# Patient Record
Sex: Female | Born: 1985 | Race: White | Hispanic: No | Marital: Single | Smoking: Current some day smoker
Health system: Southern US, Community
[De-identification: ages and names within clinical notes are randomized; demographics above are authoritative.]

## PROBLEM LIST (undated history)

## (undated) DIAGNOSIS — F419 Anxiety disorder, unspecified: Secondary | ICD-10-CM

## (undated) DIAGNOSIS — Z87442 Personal history of urinary calculi: Secondary | ICD-10-CM

## (undated) DIAGNOSIS — R519 Headache, unspecified: Secondary | ICD-10-CM

## (undated) DIAGNOSIS — R51 Headache: Secondary | ICD-10-CM

## (undated) DIAGNOSIS — L309 Dermatitis, unspecified: Secondary | ICD-10-CM

## (undated) HISTORY — PX: WISDOM TOOTH EXTRACTION: SHX21

---

## 2013-01-18 ENCOUNTER — Ambulatory Visit: Payer: Self-pay | Admitting: Family Medicine

## 2013-03-28 ENCOUNTER — Ambulatory Visit: Payer: Self-pay

## 2013-03-28 LAB — URINALYSIS, COMPLETE
Glucose,UR: NEGATIVE mg/dL (ref 0–75)
Leukocyte Esterase: NEGATIVE
Nitrite: POSITIVE
Ph: 7.5 (ref 4.5–8.0)

## 2013-05-15 ENCOUNTER — Encounter (HOSPITAL_BASED_OUTPATIENT_CLINIC_OR_DEPARTMENT_OTHER): Payer: Self-pay | Admitting: *Deleted

## 2013-05-15 ENCOUNTER — Emergency Department (HOSPITAL_BASED_OUTPATIENT_CLINIC_OR_DEPARTMENT_OTHER)
Admission: EM | Admit: 2013-05-15 | Discharge: 2013-05-15 | Disposition: A | Discharge status: Home / Self Care | Payer: BC Managed Care – PPO | Attending: Emergency Medicine | Admitting: Emergency Medicine

## 2013-05-15 DIAGNOSIS — R55 Syncope and collapse: Secondary | ICD-10-CM | POA: Insufficient documentation

## 2013-05-15 DIAGNOSIS — Z79899 Other long term (current) drug therapy: Secondary | ICD-10-CM | POA: Insufficient documentation

## 2013-05-15 DIAGNOSIS — R42 Dizziness and giddiness: Secondary | ICD-10-CM | POA: Insufficient documentation

## 2013-05-15 DIAGNOSIS — Z3202 Encounter for pregnancy test, result negative: Secondary | ICD-10-CM | POA: Insufficient documentation

## 2013-05-15 DIAGNOSIS — Z872 Personal history of diseases of the skin and subcutaneous tissue: Secondary | ICD-10-CM | POA: Insufficient documentation

## 2013-05-15 DIAGNOSIS — F411 Generalized anxiety disorder: Secondary | ICD-10-CM | POA: Insufficient documentation

## 2013-05-15 DIAGNOSIS — F17200 Nicotine dependence, unspecified, uncomplicated: Secondary | ICD-10-CM | POA: Insufficient documentation

## 2013-05-15 HISTORY — DX: Anxiety disorder, unspecified: F41.9

## 2013-05-15 HISTORY — DX: Dermatitis, unspecified: L30.9

## 2013-05-15 LAB — BASIC METABOLIC PANEL
CO2: 23 mEq/L (ref 19–32)
Calcium: 9.7 mg/dL (ref 8.4–10.5)
Creatinine, Ser: 0.7 mg/dL (ref 0.50–1.10)
GFR calc Af Amer: >90 mL/min (ref >90)
GFR calc non Af Amer: >90 mL/min (ref >90)
Sodium: 138 mEq/L (ref 135–145)

## 2013-05-15 LAB — PREGNANCY, URINE: Preg Test, Ur: NEGATIVE

## 2013-05-15 LAB — CBC WITH DIFFERENTIAL/PLATELET
Basophils Absolute: 0 10*3/uL (ref 0.0–0.1)
Basophils Relative: 0 % (ref 0–1)
Eosinophils Absolute: 0.1 10*3/uL (ref 0.0–0.7)
Eosinophils Relative: 1 % (ref 0–5)
Lymphocytes Relative: 19 % (ref 12–46)
MCHC: 36.3 g/dL — ABNORMAL HIGH (ref 30.0–36.0)
MCV: 87.2 fL (ref 78.0–100.0)
Platelets: 193 10*3/uL (ref 150–400)
RDW: 12.1 % (ref 11.5–15.5)
WBC: 8.8 10*3/uL (ref 4.0–10.5)

## 2013-05-15 MED ORDER — SODIUM CHLORIDE 0.9 % IV BOLUS (SEPSIS)
1000.0000 mL | Freq: Once | INTRAVENOUS | Status: AC
  Administered 2013-05-15: 1000 mL via INTRAVENOUS

## 2013-05-15 NOTE — ED Notes (Signed)
Patient states she felt faint while in class and fainted, states she has been under a lot of stress lately and had not been eating for the past few days

## 2013-05-15 NOTE — ED Provider Notes (Signed)
History    CSN: [id number] Arrival date & time 05/15/13  1116  First MD Initiated Contact with Patient 05/15/13 1127     Chief Complaint  Patient presents with  . Loss of Consciousness   (Consider location/radiation/quality/duration/timing/severity/associated sxs/prior Treatment) HPI Comments: Patient presents after an apparent syncopal episode.  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  She then lost consciousness and fell to the floor.  She woke several minutes later.  No reported seizure-like activity, bowel or bladder incontinence.  No injury from the fall.  She still feels light-headed.  Patient is a 27 y.o. female presenting with syncope. The history is provided by the patient.  Loss of Consciousness Episode history:  Single Most recent episode:  Today Duration:  2 minutes Timing:  Constant Progression:  Resolved Chronicity:  New Witnessed: yes   Relieved by:  Nothing Worsened by:  Nothing tried Ineffective treatments:  None tried Associated symptoms: no anxiety, no confusion and no shortness of breath    Past Medical History  Diagnosis Date  . Anxiety   . Eczema    Past Surgical History  Procedure Laterality Date  . Wisdom tooth extraction     No family history on file. History  Substance Use Topics  . Smoking status: Current Every Day Smoker  . Smokeless tobacco: Not on file  . Alcohol Use: Yes   OB History   Grav Para Term Preterm Abortions TAB SAB Ect Mult Living                 Review of Systems  Respiratory: Negative for shortness of breath.   Cardiovascular: Positive for syncope.  Psychiatric/Behavioral: Negative for confusion.  All other systems reviewed and are negative.    Allergies  Review of patient's allergies indicates no known allergies.  Home Medications   Current Outpatient Rx  Name  Route  Sig  Dispense  Refill  . ALPRAZOLAM PO   Oral   Take  by mouth as needed.          BP 115/81  Pulse 119  Temp(Src) 98.1 F (36.7 C) (Oral)  Resp 20  SpO2 100% Physical Exam  Nursing note and vitals reviewed. Constitutional: She is oriented to person, place, and time. She appears well-developed and well-nourished. No distress.  HENT:  Head: Normocephalic and atraumatic.  Mouth/Throat: Oropharynx is clear and moist.  Eyes: EOM are normal. Pupils are equal, round, and reactive to light.  Neck: Normal range of motion. Neck supple.  Cardiovascular: Normal rate and regular rhythm.  Exam reveals no gallop and no friction rub.   No murmur heard. Pulmonary/Chest: Effort normal and breath sounds normal. No respiratory distress. She has no wheezes.  Abdominal: Soft. Bowel sounds are normal. She exhibits no distension. There is no tenderness.  Musculoskeletal: Normal range of motion.  Neurological: She is alert and oriented to person, place, and time. No cranial nerve deficit. She exhibits normal muscle tone. Coordination normal.  Skin: Skin is warm and dry. She is not diaphoretic.    ED Course  Procedures (including critical care time) Labs Reviewed  CBC WITH DIFFERENTIAL  BASIC METABOLIC PANEL  PREGNANCY, URINE   No results found. No diagnosis found.    Date: 05/15/2013  Rate: 83  Rhythm: normal sinus rhythm  QRS Axis: normal  Intervals: normal  ST/T Wave abnormalities: normal  Conduction Disutrbances:none  Narrative Interpretation:   Old  EKG Reviewed: none available    MDM  The patient presents here after an apparent syncopal episode.  She reports being under a lot of stress and has not eaten or slept well recently and I suspect this likely is the cause of the episode.  She is not pregnant, not anemic, and blood sugar and other labs are all unremarkable as is the ekg.  This does not sound like a seizure.  She will be discharged to home, to return if she worsens.    Geoffery Lyons, MD 05/15/13 [phone]

## 2013-05-23 ENCOUNTER — Inpatient Hospital Stay: Payer: Self-pay | Admitting: Obstetrics and Gynecology

## 2013-05-23 ENCOUNTER — Ambulatory Visit: Payer: Self-pay

## 2013-05-23 LAB — CBC WITH DIFFERENTIAL/PLATELET
Basophil %: 0.3 %
Eosinophil #: 0 10*3/uL (ref 0.0–0.7)
Lymphocyte #: 0.5 10*3/uL — ABNORMAL LOW (ref 1.0–3.6)
MCHC: 35.2 g/dL (ref 32.0–36.0)
Platelet: 160 10*3/uL (ref 150–440)
RBC: 4.66 10*6/uL (ref 3.80–5.20)
RDW: 12.4 % (ref 11.5–14.5)
WBC: 6.7 10*3/uL (ref 3.6–11.0)

## 2013-05-23 LAB — URINALYSIS, COMPLETE
Bacteria: NEGATIVE
Bilirubin,UR: NEGATIVE
Blood: NEGATIVE
Nitrite: NEGATIVE
Ph: 6 (ref 4.5–8.0)
Protein: 30
Specific Gravity: 1.015 (ref 1.003–1.030)

## 2013-05-23 LAB — COMPREHENSIVE METABOLIC PANEL
Alkaline Phosphatase: 88 U/L (ref 50–136)
BUN: 14 mg/dL (ref 7–18)
Co2: 26 mmol/L (ref 21–32)
EGFR (African American): >60
EGFR (Non-African Amer.): >60
Glucose: 91 mg/dL (ref 65–99)
SGOT(AST): 21 U/L (ref 15–37)
SGPT (ALT): 26 U/L (ref 12–78)

## 2013-05-23 LAB — PREGNANCY, URINE: Pregnancy Test, Urine: NEGATIVE m[IU]/mL

## 2013-05-24 LAB — WET PREP, GENITAL

## 2013-05-24 LAB — CBC WITH DIFFERENTIAL/PLATELET
Basophil #: 0 10*3/uL (ref 0.0–0.1)
Eosinophil %: 0.2 %
HGB: 13.9 g/dL (ref 12.0–16.0)
Lymphocyte %: 13.3 %
MCH: 31.1 pg (ref 26.0–34.0)
Monocyte %: 6 %
Neutrophil #: 4.9 10*3/uL (ref 1.4–6.5)
Platelet: 153 10*3/uL (ref 150–440)
RBC: 4.47 10*6/uL (ref 3.80–5.20)
WBC: 6.1 10*3/uL (ref 3.6–11.0)

## 2013-05-24 LAB — BASIC METABOLIC PANEL
Anion Gap: 7 (ref 7–16)
BUN: 15 mg/dL (ref 7–18)
Calcium, Total: 8.1 mg/dL — ABNORMAL LOW (ref 8.5–10.1)
Creatinine: 0.74 mg/dL (ref 0.60–1.30)
EGFR (Non-African Amer.): >60
Glucose: 86 mg/dL (ref 65–99)

## 2013-05-25 LAB — CBC WITH DIFFERENTIAL/PLATELET
Basophil %: 0.5 %
Eosinophil %: 3.2 %
HCT: 36.7 % (ref 35.0–47.0)
Lymphocyte #: 1.5 10*3/uL (ref 1.0–3.6)
Lymphocyte %: 38.6 %
MCH: 31.6 pg (ref 26.0–34.0)
MCHC: 35.7 g/dL (ref 32.0–36.0)
Monocyte #: 0.5 x10 3/mm (ref 0.2–0.9)
Monocyte %: 12.3 %
Platelet: 149 10*3/uL — ABNORMAL LOW (ref 150–440)
WBC: 3.8 10*3/uL (ref 3.6–11.0)

## 2013-05-25 LAB — BASIC METABOLIC PANEL
BUN: 10 mg/dL (ref 7–18)
Calcium, Total: 8.6 mg/dL (ref 8.5–10.1)
Chloride: 105 mmol/L (ref 98–107)
Co2: 32 mmol/L (ref 21–32)
Glucose: 98 mg/dL (ref 65–99)
Potassium: 3.7 mmol/L (ref 3.5–5.1)
Sodium: 143 mmol/L (ref 136–145)

## 2013-05-28 LAB — CULTURE, BLOOD (SINGLE)

## 2013-12-30 ENCOUNTER — Emergency Department: Payer: Self-pay | Admitting: Emergency Medicine

## 2013-12-30 LAB — CBC
HCT: 42.7 % (ref 35.0–47.0)
HGB: 14.7 g/dL (ref 12.0–16.0)
MCH: 31.1 pg (ref 26.0–34.0)
MCHC: 34.5 g/dL (ref 32.0–36.0)
MCV: 90 fL (ref 80–100)
Platelet: 204 10*3/uL (ref 150–440)
RBC: 4.73 10*6/uL (ref 3.80–5.20)
RDW: 12.4 % (ref 11.5–14.5)
WBC: 7.5 10*3/uL (ref 3.6–11.0)

## 2013-12-30 LAB — URINALYSIS, COMPLETE
BACTERIA: NONE SEEN
BLOOD: NEGATIVE
Bilirubin,UR: NEGATIVE
GLUCOSE, UR: NEGATIVE mg/dL (ref 0–75)
KETONE: NEGATIVE
Leukocyte Esterase: NEGATIVE
NITRITE: NEGATIVE
PH: 7 (ref 4.5–8.0)
PROTEIN: NEGATIVE
RBC,UR: 3 /HPF (ref 0–5)
Specific Gravity: 1.012 (ref 1.003–1.030)
WBC UR: 8 /HPF (ref 0–5)

## 2013-12-30 LAB — COMPREHENSIVE METABOLIC PANEL
ALT: 24 U/L (ref 12–78)
AST: 14 U/L — AB (ref 15–37)
Albumin: 4.5 g/dL (ref 3.4–5.0)
Alkaline Phosphatase: 82 U/L
Anion Gap: 8 (ref 7–16)
BUN: 12 mg/dL (ref 7–18)
Bilirubin,Total: 0.7 mg/dL (ref 0.2–1.0)
CO2: 28 mmol/L (ref 21–32)
Calcium, Total: 9.1 mg/dL (ref 8.5–10.1)
Chloride: 101 mmol/L (ref 98–107)
Creatinine: 0.91 mg/dL (ref 0.60–1.30)
EGFR (African American): >60
Glucose: 93 mg/dL (ref 65–99)
OSMOLALITY: 273 (ref 275–301)
POTASSIUM: 3.4 mmol/L — AB (ref 3.5–5.1)
Sodium: 137 mmol/L (ref 136–145)
TOTAL PROTEIN: 8 g/dL (ref 6.4–8.2)

## 2014-02-15 ENCOUNTER — Ambulatory Visit: Payer: Self-pay

## 2014-03-02 ENCOUNTER — Ambulatory Visit: Payer: Self-pay | Admitting: Family Medicine

## 2014-03-02 LAB — BASIC METABOLIC PANEL
ANION GAP: 10 (ref 7–16)
BUN: 14 mg/dL (ref 7–18)
CALCIUM: 8.8 mg/dL (ref 8.5–10.1)
CO2: 29 mmol/L (ref 21–32)
CREATININE: 0.74 mg/dL (ref 0.60–1.30)
Chloride: 100 mmol/L (ref 98–107)
EGFR (African American): >60
GLUCOSE: 89 mg/dL (ref 65–99)
Osmolality: 277 (ref 275–301)
Potassium: 3.6 mmol/L (ref 3.5–5.1)
SODIUM: 139 mmol/L (ref 136–145)

## 2014-03-02 LAB — CBC WITH DIFFERENTIAL/PLATELET
Basophil #: 0.1 10*3/uL (ref 0.0–0.1)
Basophil %: 1 %
EOS ABS: 0.4 10*3/uL (ref 0.0–0.7)
EOS PCT: 6.4 %
HCT: 43.3 % (ref 35.0–47.0)
HGB: 14.7 g/dL (ref 12.0–16.0)
LYMPHS PCT: 30.2 %
Lymphocyte #: 1.8 10*3/uL (ref 1.0–3.6)
MCH: 30.8 pg (ref 26.0–34.0)
MCHC: 33.9 g/dL (ref 32.0–36.0)
MCV: 91 fL (ref 80–100)
MONOS PCT: 7.3 %
Monocyte #: 0.4 x10 3/mm (ref 0.2–0.9)
Neutrophil #: 3.3 10*3/uL (ref 1.4–6.5)
Neutrophil %: 55.1 %
PLATELETS: 198 10*3/uL (ref 150–440)
RBC: 4.76 10*6/uL (ref 3.80–5.20)
RDW: 13 % (ref 11.5–14.5)
WBC: 5.9 10*3/uL (ref 3.6–11.0)

## 2014-03-02 LAB — SEDIMENTATION RATE: ERYTHROCYTE SED RATE: 1 mm/h (ref 0–20)

## 2014-04-10 ENCOUNTER — Ambulatory Visit: Payer: Self-pay | Admitting: Family Medicine

## 2014-04-10 LAB — URINALYSIS, COMPLETE
Bilirubin,UR: NEGATIVE
Glucose,UR: NEGATIVE mg/dL (ref 0–75)
Ketone: NEGATIVE
NITRITE: POSITIVE
Ph: 8 (ref 4.5–8.0)
Protein: 30
SQUAMOUS EPITHELIAL: NONE SEEN
Specific Gravity: 1.01 (ref 1.003–1.030)

## 2014-04-10 LAB — CBC WITH DIFFERENTIAL/PLATELET
BASOS ABS: 0.1 10*3/uL (ref 0.0–0.1)
BASOS PCT: 0.8 %
EOS ABS: 0.2 10*3/uL (ref 0.0–0.7)
EOS PCT: 1.5 %
HCT: 38.4 % (ref 35.0–47.0)
HGB: 13.2 g/dL (ref 12.0–16.0)
LYMPHS ABS: 1.3 10*3/uL (ref 1.0–3.6)
Lymphocyte %: 13.2 %
MCH: 31 pg (ref 26.0–34.0)
MCHC: 34.5 g/dL (ref 32.0–36.0)
MCV: 90 fL (ref 80–100)
MONO ABS: 0.9 x10 3/mm (ref 0.2–0.9)
Monocyte %: 8.8 %
NEUTROS ABS: 7.7 10*3/uL — AB (ref 1.4–6.5)
NEUTROS PCT: 75.7 %
Platelet: 188 10*3/uL (ref 150–440)
RBC: 4.27 10*6/uL (ref 3.80–5.20)
RDW: 12.6 % (ref 11.5–14.5)
WBC: 10.1 10*3/uL (ref 3.6–11.0)

## 2014-04-10 LAB — BASIC METABOLIC PANEL
Anion Gap: 11 (ref 7–16)
BUN: 13 mg/dL (ref 7–18)
CHLORIDE: 101 mmol/L (ref 98–107)
CREATININE: 0.79 mg/dL (ref 0.60–1.30)
Calcium, Total: 8.8 mg/dL (ref 8.5–10.1)
Co2: 27 mmol/L (ref 21–32)
EGFR (African American): >60
Glucose: 88 mg/dL (ref 65–99)
OSMOLALITY: 277 (ref 275–301)
Potassium: 3.9 mmol/L (ref 3.5–5.1)
Sodium: 139 mmol/L (ref 136–145)

## 2014-04-10 LAB — PREGNANCY, URINE: PREGNANCY TEST, URINE: NEGATIVE m[IU]/mL

## 2014-04-10 LAB — RAPID INFLUENZA A&B ANTIGENS

## 2014-04-12 LAB — URINE CULTURE

## 2015-03-08 NOTE — H&P (Signed)
PATIENT NAME:  Tanya Patterson, Tanya Patterson MR#:  [id number] DATE OF BIRTH:  27-Apr-1986  DATE OF ADMISSION:  05/23/2013  ATTENDING PHYSICIAN Dr. Manson PasseyBrown CONSULTING PHYSICIAN:    Suzy Bouchardhomas J. Dede Dobesh, MD   HISTORY OF PRESENT ILLNESS:  A 29 year old gravida 3, para 2, 1 voluntary interruption of pregnancy.  The patient was seen in [facility] earlier this evening with a 1-day history of lower pelvic pain, fever and anorexia. The patient denies nausea or vomiting and denies any abnormal vaginal discharge. The patient has been with a new partner for 9 weeks and has had unprotected intercourse.  PAST MEDICAL HISTORY: Anxiety disorder.   PAST SURGICAL HISTORY: Wisdom tooth extraction.   REVIEW OF SYSTEMS: Unremarkable.   FAMILY HISTORY: Unremarkable.   MEDICATIONS:  Xanax as needed.   SOCIAL HISTORY: Drinks occasionally and smokes occasionally as well.   ALLERGIES: No known drug allergies.   PHYSICAL EXAMINATION: GENERAL: Well-developed, well-nourished, ill-appearing white female. VITAL SIGNS: Temperature 99 with reported temperature of 103.7 earlier today, pulse 114, blood pressure 115/78.  LUNGS: Clear to auscultation.  CARDIOVASCULAR: Regular rate and rhythm.  BACK:  No CVA tenderness.  ABDOMEN: Nondistended. Normoactive bowel sounds.  Diffuse tenderness with no rebound tenderness.  PELVIC: Bimanual exam positive cervical motion tenderness. GC, Chlamydia cultures performed. Uterus normal size, shape and contour, tender. Bilateral adnexal tenderness and fullness slightly on the right, none on the left.  RECTOVAGINAL: Exam is deferred.   LABORATORY DATA: White blood count 6.7, hematocrit 41.2, platelets 160,000.  Sodium 140, potassium 3.3, BUN of 14 and creatinine of 0.8. Total bilirubin 0.8, SGOT and SGPT 21 and 26, respectively.  Negative hCG.  Urinalysis negative.   ASSESSMENT: Acute onset of lower abdominal pain, cervical motion tenderness, adnexal tenderness with possible fullness on  the right consistent with pelvic inflammatory disease, rule out tubo-ovarian abscess.   PLAN: Admission to mother/infant, IV antibiotics 2 grams IV cefoxitin q. 6 hours and doxycycline 100 mg twice a day, Tylenol as needed. IV hydration.  Pelvic ultrasound will be ordered tonight to look for abscess formation. GC, Chlamydia performed. All questions are answered.    ____________________________ Suzy Bouchardhomas J. Royetta Probus, MD tjs:cb D: 05/23/2013 23:24:44 ET T: 05/23/2013 23:42:00 ET JOB#: [id number]  cc: Suzy Bouchardhomas J. Nyazia Canevari, MD, <Dictator> Suzy BouchardHOMAS J Aki Burdin MD ELECTRONICALLY SIGNED 05/25/2013 8:57

## 2015-10-06 ENCOUNTER — Ambulatory Visit
Admission: EM | Admit: 2015-10-06 | Discharge: 2015-10-06 | Disposition: A | Discharge status: Home / Self Care | Payer: Self-pay | Attending: Family Medicine | Admitting: Family Medicine

## 2015-10-06 ENCOUNTER — Ambulatory Visit: Payer: Self-pay

## 2015-10-06 DIAGNOSIS — J40 Bronchitis, not specified as acute or chronic: Secondary | ICD-10-CM

## 2015-10-06 LAB — PREGNANCY, URINE: Preg Test, Ur: NEGATIVE

## 2015-10-06 MED ORDER — AZITHROMYCIN 250 MG PO TABS: ORAL_TABLET | ORAL | Status: DC

## 2015-10-06 MED ORDER — ALBUTEROL SULFATE HFA 108 (90 BASE) MCG/ACT IN AERS: 1.0000 | INHALATION_SPRAY | Freq: Four times a day (QID) | RESPIRATORY_TRACT | Status: DC | PRN

## 2015-10-06 MED ORDER — PREDNISONE 10 MG PO TABS: 20.0000 mg | ORAL_TABLET | Freq: Every day | ORAL | Status: DC

## 2015-10-06 NOTE — ED Provider Notes (Signed)
Patient presents today with symptoms of nonproductive cough for the last 2 weeks. Patient has had some people that she has been around that have had URIs. She denies any fever, nausea, vomiting, chest pain shortness of breath. She denies any history of DVT or PE. She denies any history of asthma or COPD. She does smoke according to Epic. She is worried about walking pneumonia however cannot afford to have a chest x-ray done. She denies any possibility of a new allergen causing her symptoms.  ROS: Negative except mentioned above.  Vitals as per Epic  GENERAL: NAD HEENT: mild pharyngeal erythema, no exudate, no erythema of TMs, no cervical LAD RESP: CTA B CARD: RRR EXTREM: -Homans NEURO: CN II-XII grossly intact   A/P: Bronchitis- Will treat with Z-Pak, Albuterol when necessary, Prednisone short course or early, would recommend chest x-ray if symptoms do persist or worsen as discussed.  Tanya ProvostKirtida Aluel Schwarz, MD 10/06/15 [phone]

## 2016-03-09 ENCOUNTER — Encounter: Payer: Self-pay | Admitting: Gynecology

## 2016-03-09 ENCOUNTER — Ambulatory Visit
Admission: EM | Admit: 2016-03-09 | Discharge: 2016-03-09 | Disposition: A | Discharge status: Home / Self Care | Payer: BLUE CROSS/BLUE SHIELD | Attending: Family Medicine | Admitting: Family Medicine

## 2016-03-09 DIAGNOSIS — N12 Tubulo-interstitial nephritis, not specified as acute or chronic: Secondary | ICD-10-CM

## 2016-03-09 LAB — URINALYSIS COMPLETE WITH MICROSCOPIC (ARMC ONLY)
GLUCOSE, UA: NEGATIVE mg/dL
Hgb urine dipstick: NEGATIVE
Leukocytes, UA: NEGATIVE
Nitrite: POSITIVE — AB
Protein, ur: 100 mg/dL — AB
RBC / HPF: NONE SEEN RBC/hpf (ref 0–5)
pH: 6 (ref 5.0–8.0)

## 2016-03-09 LAB — PREGNANCY, URINE: Preg Test, Ur: NEGATIVE

## 2016-03-09 MED ORDER — ONDANSETRON 4 MG PO TBDP: 4.0000 mg | ORAL_TABLET | Freq: Three times a day (TID) | ORAL | Status: DC | PRN

## 2016-03-09 MED ORDER — CIPROFLOXACIN HCL 500 MG PO TABS: 500.0000 mg | ORAL_TABLET | Freq: Two times a day (BID) | ORAL | Status: DC

## 2016-03-09 NOTE — Discharge Instructions (Signed)
Take medication as prescribed. Rest. Drink plenty of fluids. Take over the counter tylenol as needed.   Follow up with your primary care physician this week. Return to Urgent care or proceed directly to ER for fever, inability to tolerate food or fluids, new or worsening concerns.    Pyelonephritis, Adult Pyelonephritis is a kidney infection. The kidneys are the organs that filter a person's blood and move waste out of the bloodstream and into the urine. Urine passes from the kidneys, through the ureters, and into the bladder. There are two main types of pyelonephritis:  Infections that come on quickly without any warning (acute pyelonephritis).  Infections that last for a long period of time (chronic pyelonephritis). In most cases, the infection clears up with treatment and does not cause further problems. More severe infections or chronic infections can sometimes spread to the bloodstream or lead to other problems with the kidneys. CAUSES This condition is usually caused by:  Bacteria traveling from the bladder to the kidney through infected urine. The urine in the bladder can become infected with bacteria from:  Bladder infection (cystitis).  Inflammation of the prostate gland (prostatitis).  Sexual intercourse, in females.  Bacteria traveling from the bloodstream to the kidney. RISK FACTORS This condition is more likely to develop in:  Pregnant women.  Older people.  People who have diabetes.  People who have kidney stones or bladder stones.  People who have other abnormalities of the kidney or ureter.  People who have a catheter placed in the bladder.  People who have cancer.  People who are sexually active.  Women who use spermicides.  People who have had a prior urinary tract infection. SYMPTOMS Symptoms of this condition include:  Frequent urination.  Strong or persistent urge to urinate.  Burning or stinging when urinating.  Abdominal pain.  Back  pain.  Pain in the side or flank area.  Fever.  Chills.  Blood in the urine, or dark urine.  Nausea.  Vomiting. DIAGNOSIS This condition may be diagnosed based on:  Medical history and physical exam.  Urine tests.  Blood tests. You may also have imaging tests of the kidneys, such as an ultrasound or CT scan. TREATMENT Treatment for this condition may depend on the severity of the infection.  If the infection is mild and is found early, you may be treated with antibiotic medicines taken by mouth. You will need to drink fluids to remain hydrated.  If the infection is more severe, you may need to stay in the hospital and receive antibiotics given directly into a vein through an IV tube. You may also need to receive fluids through an IV tube if you are not able to remain hydrated. After your hospital stay, you may need to take oral antibiotics for a period of time. Other treatments may be required, depending on the cause of the infection. HOME CARE INSTRUCTIONS Medicines  Take over-the-counter and prescription medicines only as told by your health care provider.  If you were prescribed an antibiotic medicine, take it as told by your health care provider. Do not stop taking the antibiotic even if you start to feel better. General Instructions  Drink enough fluid to keep your urine clear or pale yellow.  Avoid caffeine, tea, and carbonated beverages. They tend to irritate the bladder.  Urinate often. Avoid holding in urine for long periods of time.  Urinate before and after sex.  After a bowel movement, women should cleanse from front to back. Use each  tissue only once.  Keep all follow-up visits as told by your health care provider. This is important. SEEK MEDICAL CARE IF:  Your symptoms do not get better after 2 days of treatment.  Your symptoms get worse.  You have a fever. SEEK IMMEDIATE MEDICAL CARE IF:  You are unable to take your antibiotics or fluids.  You  have shaking chills.  You vomit.  You have severe flank or back pain.  You have extreme weakness or fainting.   This information is not intended to replace advice given to you by your health care provider. Make sure you discuss any questions you have with your health care provider.   Document Released: 11/02/2005 Document Revised: 07/24/2015 Document Reviewed: 02/25/2015 Elsevier Interactive Patient Education [employer]2016 Elsevier Inc.

## 2016-03-09 NOTE — ED Provider Notes (Signed)
[facility]  ____________________________________________  Time seen: Approximately 8:23 PM  I have reviewed the triage vital signs and the nursing notes.   HISTORY  Chief Complaint Fever; Abdominal Cramping; and Back Pain   HPI Tanya Patterson is a 30 y.o. female reports since yesterday with low midline back pain, overall not feeling well with report of fever. Reports yesterday a fever up to 102 orally. Reports hasn't taken over-the-counter Tylenol intermittently to control fever. Reports yesterday she had some generalized abdominal discomfort but reports no abdominal discomfort or pain at this time. Denies urinary frequency, a urinary urgency or burning with urination. Denies vaginal discharge, vaginal odor or vaginal complaints. Denies recent fall or injury. Back pain unaffected by movement. Reports chills and some mild nausea. Denies vomiting. Reports has continued to tolerate fluids well but slight decrease in appetite. Denies recent sickness. Reports boyfriend recently with similar abdominal discomfort. Denies abnormal food.   Reports last uti 1.5 years ago. Reports history of pyelonephritis.  Denies vomiting, diarrhea, current abdominal pain, chest pain, shortness of breath, dizziness or weakness. Denies recent antibiotic use.  LMP: nuvaring. States last menstrual one month ago. Denies chance of pregnancy.      Past Medical History  Diagnosis Date  . Anxiety   . Eczema     There are no active problems to display for this patient.   Past Surgical History  Procedure Laterality Date  . Wisdom tooth extraction      Current Outpatient Rx  Name  Route  Sig  Dispense  Refill  .           Marland Kitchen ALPRAZOLAM PO   Oral   Take by mouth as needed.                                 Allergies Review of patient's allergies indicates no known allergies.  No family history on file.  Social History Social History  Substance Use Topics  . Smoking status:  Current Every Day Smoker -- 0.50 packs/day    Types: Cigarettes  . Smokeless tobacco: None  . Alcohol Use: Yes     Comment: social    Review of Systems Constitutional: No fever/chills Eyes: No visual changes. ENT: No sore throat. Cardiovascular: Denies chest pain. Respiratory: Denies shortness of breath. Gastrointestinal: No abdominal pain.  No nausea, no vomiting.  No diarrhea.  No constipation. Genitourinary: Negative for dysuria. Musculoskeletal: positive for back pain. Skin: Negative for rash. Neurological: Negative for headaches, focal weakness or numbness.  10-point ROS otherwise negative.  ____________________________________________   PHYSICAL EXAM:  VITAL SIGNS: ED Triage Vitals  Enc Vitals Group     BP 03/09/16 1933 122/86 mmHg     Pulse Rate 03/09/16 1933 111 Recheck 94     Resp 03/09/16 1933 16     Temp 03/09/16 1933 98.9 F (37.2 C)     Temp Source 03/09/16 1933 Oral     SpO2 03/09/16 1933 100 %     Weight 03/09/16 1933 130 lb (58.968 kg)     Height 03/09/16 1933  (1.549 m)     Head Cir --      Peak Flow --      Pain Score 03/09/16 1933 6     Pain Loc --      Pain Edu? --      Excl. in GC? --     Constitutional: Alert and oriented. Well  appearing and in no acute distress. Eyes: Conjunctivae are normal. PERRL. EOMI. Head: Atraumatic.  Nose: No congestion/rhinnorhea.  Mouth/Throat: Mucous membranes are moist.  Oropharynx non-erythematous. Neck: No stridor.  No cervical spine tenderness to palpation. Hematological/Lymphatic/Immunilogical: No cervical lymphadenopathy. Cardiovascular: Normal rate, regular rhythm. Grossly normal heart sounds.  Good peripheral circulation. Respiratory: Normal respiratory effort.  No retractions. Lungs CTAB.No wheezes, rales or rhonchi. Gastrointestinal: Soft and nontender.Normal Bowel sounds.  No abdominal bruits. No right CVA tenderness. Moderate left CVA tenderness. Musculoskeletal: No lower or upper extremity  tenderness nor edema. No midline cervical, thoracic or lumbar tenderness to palpation.   Neurologic:  Normal speech and language. No gross focal neurologic deficits are appreciated. No gait instability. Skin:  Skin is warm, dry and intact. No rash noted. Psychiatric: Mood and affect are normal. Speech and behavior are normal.  ____________________________________________   LABS (all labs ordered are listed, but only abnormal results are displayed)  Labs Reviewed  URINALYSIS COMPLETEWITH MICROSCOPIC ([facility] ONLY) - Abnormal; Notable for the following:    Color, Urine AMBER (*)    APPearance HAZY (*)    Bilirubin Urine 1+ (*)    Ketones, ur 3+ (*)    Specific Gravity, Urine >1.030 (*)    Protein, ur 100 (*)    Nitrite POSITIVE (*)    Bacteria, UA MANY (*)    Squamous Epithelial / LPF 0-5 (*)    All other components within normal limits  URINE CULTURE  PREGNANCY, URINE   _ INITIAL IMPRESSION / ASSESSMENT AND PLAN / ED COURSE  Pertinent labs & imaging results that were available during my care of the patient were reviewed by me and considered in my medical decision making (see chart for details).  Overall well-appearing patient. No acute distress. Presents for the complaints of 1 day of intermittent fever, chills, left CVA pain and nausea. Urinalysis positive for many bacteria positive nitrate, amber color hazy appearance with 3+ ketones. Patient able to tolerate food and fluids per patient. Afebrile at this time. Vital signs stable. Suspect left pyelonephritis. Discussed in detail with patient evaluation of labs as well as IM Rocephin in urgent care. Patient refused lab draw as well as any IM antibiotics. Patient states that she understands risk and states that she does not want labs or IM antibiotics at this time. Counseled patient will treat with oral Cipro and when necessary Zofran. Encouraged rest, fluids and close PCP follow-up. Discussed with patient if unable to tolerate food or  fluids, increased pain, persistent fever or any other concerns procedure to the emergency room for further evaluation and possible admission.   Discussed follow up with Primary care physician this week. Discussed follow up and return parameters including no resolution or any worsening concerns. Patient verbalized understanding and agreed to plan.   ____________________________________________   FINAL CLINICAL IMPRESSION(S) / ED DIAGNOSES  Final diagnoses:  Pyelonephritis      Note: This dictation was prepared with Dragon dictation along with smaller phrase technology. Any transcriptional errors that result from this process are unintentional.    Renford DillsLindsey Aydrien Froman, NP 03/09/16 2056

## 2016-03-09 NOTE — ED Notes (Signed)
Patient c/o fever at home of 102 / lowe back pain / stomach cramps/ weakness / and chills x yesterday.

## 2016-03-12 LAB — URINE CULTURE: Culture: >=100000 — AB

## 2016-05-24 ENCOUNTER — Ambulatory Visit (INDEPENDENT_AMBULATORY_CARE_PROVIDER_SITE_OTHER): Payer: BLUE CROSS/BLUE SHIELD

## 2016-05-24 ENCOUNTER — Ambulatory Visit
Admission: EM | Admit: 2016-05-24 | Discharge: 2016-05-24 | Disposition: A | Discharge status: Home / Self Care | Payer: BLUE CROSS/BLUE SHIELD | Attending: Family Medicine | Admitting: Family Medicine

## 2016-05-24 DIAGNOSIS — S92911A Unspecified fracture of right toe(s), initial encounter for closed fracture: Secondary | ICD-10-CM

## 2016-05-24 NOTE — ED Provider Notes (Signed)
CSN: [id number]     Arrival date & time 05/24/16  [id number] History   First MD Initiated Contact with Patient 05/24/16 1132     Chief Complaint  Patient presents with  . Toe Pain   (Consider location/radiation/quality/duration/timing/severity/associated sxs/prior Treatment) HPI: Patient presents today with symptoms of right fifth toe injury. Patient states that she ran into a wall yesterday afternoon. She has noticed swelling and pain of the area. She denies any abrasions to the toe. She denies any previous injury to the toe in the past. She denies any other injuries anywhere else.  Past Medical History  Diagnosis Date  . Anxiety   . Eczema    Past Surgical History  Procedure Laterality Date  . Wisdom tooth extraction     No family history on file. Social History  Substance Use Topics  . Smoking status: Current Every Day Smoker -- 0.50 packs/day    Types: Cigarettes  . Smokeless tobacco: None  . Alcohol Use: Yes     Comment: social   OB History    No data available     Review of Systems: Negative except mentioned above.   Allergies  Review of patient's allergies indicates no known allergies.  Home Medications   Prior to Admission medications   Medication Sig Start Date End Date Taking? Authorizing Provider  ALPRAZOLAM PO Take by mouth as needed.   Yes Historical Provider, MD  etonogestrel-ethinyl estradiol (NUVARING) 0.12-0.015 MG/24HR vaginal ring Place 1 each vaginally every 28 (twenty-eight) days. Insert vaginally and leave in place for 3 consecutive weeks, then remove for 1 week.   Yes Historical Provider, MD  albuterol (PROVENTIL HFA;VENTOLIN HFA) 108 (90 BASE) MCG/ACT inhaler Inhale 1-2 puffs into the lungs every 6 (six) hours as needed for wheezing or shortness of breath. 10/06/15   Jolene ProvostKirtida Zaylei Mullane, MD  azithromycin (ZITHROMAX Z-PAK) 250 MG tablet Use for 5 days as on box. 10/06/15   Jolene ProvostKirtida Jamarion [place], MD  ciprofloxacin (CIPRO) 500 MG tablet Take 1 tablet (500 mg total) by  mouth 2 (two) times daily. 03/09/16   Renford DillsLindsey Miller, NP  ondansetron (ZOFRAN ODT) 4 MG disintegrating tablet Take 1 tablet (4 mg total) by mouth every 8 (eight) hours as needed for nausea or vomiting. 03/09/16   Renford DillsLindsey Miller, NP  predniSONE (DELTASONE) 10 MG tablet Take 2 tablets (20 mg total) by mouth daily. 10/06/15   Jolene ProvostKirtida Kenneth Cuaresma, MD   Meds Ordered and Administered this Visit  Medications - No data to display  BP 124/85 mmHg  Pulse 82  Temp(Src) 98.4 F (36.9 C) (Tympanic)  Resp 16  Ht 5\' 1"  (1.549 m)  Wt 130 lb (58.968 kg)  BMI 24.58 kg/m2  SpO2 100% No data found.   Physical Exam:  GENERAL: NAD RESP: CTA B CARD: RRR MSK: Right 5th Toe- mild swelling and ecchymosis of toe, tenderness to palpation, some decreased ROM due to discomfort and swelling, no abrasions or streaks, nv intact  NEURO: CN II-XII grossly intact   ED Course  Procedures (including critical care time)  Labs Review Labs Reviewed - No data to display  Imaging Review Dg Toe 5th Right  05/24/2016  CLINICAL DATA:  Pain and bruising after hitting toe against solid object EXAM: RIGHT FIFTH TOE:  3 V COMPARISON:  None. FINDINGS: Frontal, oblique, and lateral views were obtained. There is a small avulsion along the dorsal aspect of the proximal portion of the fifth distal phalanx. No other fracture. No dislocation. The joint spaces appear normal. IMPRESSION: Avulsion  along the dorsal aspect proximal portion of the fifth distal phalanx. No other fracture. No dislocation. No apparent arthropathy. Electronically Signed   By: Bretta Bang III M.D.   On: 05/24/2016 11:18    MDM   1. Toe fracture, right, closed, initial encounter   Discuss results of x-ray, would buddy tape fifth and fourth digits together, postop shoe given, ice, elevation, NSAIDs when necessary, follow up with podiatry if any symptoms persist or worsen.    Jolene Provost, MD 05/24/16 [phone]

## 2016-05-24 NOTE — ED Notes (Signed)
Patient complains of right fifth toe pain. Patient states that yesterday afternoon she walked in to a wall with her toe. Patient now has swelling, throbbing, and bruising. Patient states that the toe is very painful and she tried to buddy tape and the pain was too much for her.

## 2016-05-24 NOTE — Discharge Instructions (Signed)
Rest, elevate, ice, NSAIDs when necessary, postop shoe or hard soled shoe, buddy tape fourth and fifth digits together

## 2016-06-26 ENCOUNTER — Encounter: Payer: Self-pay | Admitting: Emergency Medicine

## 2016-06-26 ENCOUNTER — Ambulatory Visit
Admission: EM | Admit: 2016-06-26 | Discharge: 2016-06-26 | Disposition: A | Discharge status: Home / Self Care | Payer: BLUE CROSS/BLUE SHIELD | Attending: Registered Nurse | Admitting: Registered Nurse

## 2016-06-26 DIAGNOSIS — E860 Dehydration: Secondary | ICD-10-CM

## 2016-06-26 DIAGNOSIS — R42 Dizziness and giddiness: Secondary | ICD-10-CM

## 2016-06-26 DIAGNOSIS — H6593 Unspecified nonsuppurative otitis media, bilateral: Secondary | ICD-10-CM

## 2016-06-26 DIAGNOSIS — L309 Dermatitis, unspecified: Secondary | ICD-10-CM | POA: Diagnosis not present

## 2016-06-26 LAB — CBC WITH DIFFERENTIAL/PLATELET
BASOS ABS: 0.1 10*3/uL (ref 0–0.1)
BASOS PCT: 1 %
Eosinophils Absolute: 0.2 10*3/uL (ref 0–0.7)
Eosinophils Relative: 4 %
HEMATOCRIT: 43.2 % (ref 35.0–47.0)
HEMOGLOBIN: 14.7 g/dL (ref 12.0–16.0)
Lymphocytes Relative: 30 %
Lymphs Abs: 2 10*3/uL (ref 1.0–3.6)
MCH: 30.3 pg (ref 26.0–34.0)
MCHC: 34 g/dL (ref 32.0–36.0)
MCV: 88.9 fL (ref 80.0–100.0)
Monocytes Absolute: 0.3 10*3/uL (ref 0.2–0.9)
Monocytes Relative: 5 %
NEUTROS ABS: 4.1 10*3/uL (ref 1.4–6.5)
NEUTROS PCT: 60 %
Platelets: 228 10*3/uL (ref 150–440)
RBC: 4.86 MIL/uL (ref 3.80–5.20)
RDW: 12.3 % (ref 11.5–14.5)
WBC: 6.7 10*3/uL (ref 3.6–11.0)

## 2016-06-26 LAB — BASIC METABOLIC PANEL
ANION GAP: 7 (ref 5–15)
BUN: 8 mg/dL (ref 6–20)
CALCIUM: 9.3 mg/dL (ref 8.9–10.3)
CHLORIDE: 105 mmol/L (ref 101–111)
CO2: 26 mmol/L (ref 22–32)
Creatinine, Ser: 0.64 mg/dL (ref 0.44–1.00)
GFR calc non Af Amer: >60 mL/min (ref >60)
Glucose, Bld: 88 mg/dL (ref 65–99)
POTASSIUM: 3.6 mmol/L (ref 3.5–5.1)
Sodium: 138 mmol/L (ref 135–145)

## 2016-06-26 MED ORDER — TRIAMCINOLONE ACETONIDE 0.1 % EX CREA: 1.0000 "application " | TOPICAL_CREAM | Freq: Two times a day (BID) | CUTANEOUS | 0 refills | Status: DC

## 2016-06-26 MED ORDER — LORATADINE 10 MG PO TABS: 10.0000 mg | ORAL_TABLET | Freq: Every day | ORAL | 0 refills | Status: DC

## 2016-06-26 MED ORDER — FLUTICASONE PROPIONATE 50 MCG/ACT NA SUSP: 1.0000 | Freq: Two times a day (BID) | NASAL | 0 refills | Status: DC

## 2016-06-26 MED ORDER — SALINE SPRAY 0.65 % NA SOLN: 2.0000 | NASAL | 0 refills | Status: DC

## 2016-06-26 MED ORDER — SODIUM CHLORIDE 0.9 % IV SOLN
Freq: Once | INTRAVENOUS | Status: AC
  Administered 2016-06-26: 999 mL via INTRAVENOUS

## 2016-06-26 NOTE — ED Provider Notes (Signed)
CSN: [id number]     Arrival date & time 06/26/16  1617 History   None    Chief Complaint  Patient presents with  . Dizziness  . 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  Went to eat lunch today with significant other and when came back felt dizzy so sat down on floor before she passed out.   Left work early today needs work note Intermittent dizzyness with position changes denied vertigo/room spinning.  Typically has menses every 3 months with nuvaring; on menses now heavy flow per patient changing pad/tampon every 4-6 hours past 3 days.  Three months ago had heavy flow also.  Denied seasonal allergies.  Denied panic attack today.  New rash left armpit this week; chronic right lower leg eczema flare/scratching  Denied any recent extra exertion/dehydration/new exercise program PMHx eczema, anxiety        Past Medical History:  Diagnosis Date  . Anxiety   . Eczema    Past Surgical History:  Procedure Laterality Date  . WISDOM TOOTH EXTRACTION     Family History  Problem Relation Age of Onset  . Hypertension Father    Social History  Substance Use Topics  . Smoking status: Current Every Day Smoker    Packs/day: 0.50    Types: Cigarettes  . Smokeless tobacco: Never Used  . Alcohol use Yes     Comment: social   OB History    No data available     Review of Systems  Constitutional: Negative for activity change, appetite change, chills, diaphoresis, fatigue and fever.  HENT: Negative for congestion, dental problem, drooling, ear discharge, ear pain, facial swelling, hearing loss, mouth sores, nosebleeds, postnasal drip, rhinorrhea, sinus pressure, sneezing, sore throat, tinnitus, trouble swallowing and voice change.   Eyes: Negative for photophobia, pain, discharge, redness, itching and visual disturbance.    Respiratory: Negative for cough, shortness of breath, wheezing and stridor.   Cardiovascular: Negative for chest pain, palpitations and leg swelling.  Gastrointestinal: Negative for abdominal distention, abdominal pain, constipation, diarrhea, nausea and vomiting.  Endocrine: Negative for polydipsia, polyphagia and polyuria.  Genitourinary: Positive for vaginal bleeding. Negative for decreased urine volume, difficulty urinating, dysuria, hematuria, menstrual problem, pelvic pain, vaginal discharge and vaginal pain.  Musculoskeletal: Negative for arthralgias, back pain, gait problem, joint swelling, myalgias, neck pain and neck stiffness.  Skin: Positive for color change and rash. Negative for pallor and wound.  Allergic/Immunologic: Positive for environmental allergies. Negative for food allergies.  Neurological: Positive for dizziness, syncope and light-headedness. Negative for tremors, seizures, facial asymmetry, speech difficulty, weakness, numbness and headaches.  Hematological: Negative for adenopathy. Does not bruise/bleed easily.  Psychiatric/Behavioral: Negative for sleep disturbance.  All other systems reviewed and are negative.   Allergies  Blueberry flavor  Home Medications   Prior to Admission medications   Medication Sig Start Date End Date Taking? Authorizing Provider  ALPRAZOLAM PO Take 0.5 mg by mouth as needed.    Yes Historical Provider, MD  etonogestrel-ethinyl estradiol (NUVARING) 0.12-0.015 MG/24HR vaginal ring Place 1 each vaginally every 28 (twenty-eight) days. Insert vaginally and leave in place for 3 consecutive weeks, then remove for 1 week.   Yes Historical Provider, MD  fluticasone (FLONASE) 50 MCG/ACT nasal spray Place 1 spray into both nostrils 2 (two) times daily. 06/26/16   Barbaraann Barthel, NP  loratadine (CLARITIN) 10 MG tablet Take 1 tablet (10 mg total) by  mouth daily. 06/26/16 07/26/16  Barbaraann Barthel, NP  sodium chloride (OCEAN) 0.65 % SOLN nasal  spray Place 2 sprays into both nostrils every 2 (two) hours while awake. 06/26/16 07/26/16  Barbaraann Barthel, NP  triamcinolone cream (KENALOG) 0.1 % Apply 1 application topically 2 (two) times daily. 06/26/16   Barbaraann Barthel, NP   Meds Ordered and Administered this Visit   Medications  0.9 %  sodium chloride infusion (0 mLs Intravenous Stopped 06/26/16 1906)    BP (!) 149/108 (BP Location: Left Arm)   Pulse 93   Temp 98.9 F (37.2 C) (Oral)   Resp 18   Ht  (1.549 m)   Wt 130 lb (59 kg)   SpO2 100%   BMI 24.56 kg/m  Orthostatic VS for the past 24 hrs:  BP- Lying Pulse- Lying BP- Sitting Pulse- Sitting BP- Standing at 0 minutes Pulse- Standing at 0 minutes  06/26/16 1906 120/73 58 (!) 136/91 75 (!) 149/91 82  06/26/16 1717 123/89 78 (!) 125/91 72 (!) 133/93 102    Physical Exam  Constitutional: She is oriented to person, place, and time. She appears well-developed and well-nourished. She is active and cooperative.  Non-toxic appearance. She does not have a sickly appearance. She does not appear ill. No distress.  HENT:  Head: Normocephalic and atraumatic.  Right Ear: Hearing, external ear and ear canal normal. A middle ear effusion is present.  Left Ear: Hearing, external ear and ear canal normal. A middle ear effusion is present.  Nose: Mucosal edema and rhinorrhea present. No nose lacerations, sinus tenderness, nasal deformity, septal deviation or nasal septal hematoma. No epistaxis.  No foreign bodies. Right sinus exhibits no maxillary sinus tenderness and no frontal sinus tenderness. Left sinus exhibits no maxillary sinus tenderness and no frontal sinus tenderness.  Mouth/Throat: Uvula is midline and mucous membranes are normal. Mucous membranes are not pale, not dry and not cyanotic. She does not have dentures. No oral lesions. No trismus in the jaw. Normal dentition. No dental abscesses, uvula swelling, lacerations or dental caries. Posterior oropharyngeal edema and  posterior oropharyngeal erythema present. No oropharyngeal exudate or tonsillar abscesses.  Cobblestoning posterior pharynx; bilateral TMs with air fluid level clear; bilateral nasal turbinates edema/erythema clear discharge; bilateral allergic shiners  Eyes: Conjunctivae, EOM and lids are normal. Pupils are equal, round, and reactive to light. Right eye exhibits no chemosis, no discharge, no exudate and no hordeolum. No foreign body present in the right eye. Left eye exhibits no chemosis, no discharge, no exudate and no hordeolum. No foreign body present in the left eye. Right conjunctiva is not injected. Right conjunctiva has no hemorrhage. Left conjunctiva is not injected. Left conjunctiva has no hemorrhage. No scleral icterus. Right eye exhibits normal extraocular motion and no nystagmus. Left eye exhibits normal extraocular motion and no nystagmus. Right pupil is round and reactive. Left pupil is round and reactive. Pupils are equal.  Neck: Trachea normal and normal range of motion. Neck supple. No tracheal tenderness, no spinous process tenderness and no muscular tenderness present. No neck rigidity. No tracheal deviation, no edema, no erythema and normal range of motion present. No thyroid mass and no thyromegaly present.  Cardiovascular: Normal rate, regular rhythm, S1 normal, S2 normal, normal heart sounds and intact distal pulses.  PMI is not displaced.  Exam reveals no gallop and no friction rub.   No murmur heard. Pulses:      Radial pulses are 2+ on the right side, and  2+ on the left side.  Pulmonary/Chest: Effort normal and breath sounds normal. No accessory muscle usage or stridor. No respiratory distress. She has no decreased breath sounds. She has no wheezes. She has no rhonchi. She has no rales. She exhibits no tenderness.  Abdominal: Soft. Normal appearance. She exhibits no distension.  Musculoskeletal: Normal range of motion. She exhibits no edema or tenderness.       Right shoulder:  Normal.       Left shoulder: Normal.       Right hip: Normal.       Left hip: Normal.       Right knee: Normal.       Left knee: Normal.       Right ankle: Normal.       Left ankle: Normal.       Cervical back: Normal.       Right hand: Normal.       Left hand: Normal.       Right lower leg: Normal.       Left lower leg: Normal.  Lymphadenopathy:       Head (right side): No submental, no submandibular, no tonsillar, no preauricular, no posterior auricular and no occipital adenopathy present.       Head (left side): No submental, no submandibular, no tonsillar, no preauricular, no posterior auricular and no occipital adenopathy present.    She has no cervical adenopathy.       Right cervical: No superficial cervical, no deep cervical and no posterior cervical adenopathy present.      Left cervical: No superficial cervical, no deep cervical and no posterior cervical adenopathy present.  Neurological: She is alert and oriented to person, place, and time. She has normal strength. She is not disoriented. She displays no atrophy and no tremor. No cranial nerve deficit or sensory deficit. She exhibits normal muscle tone. She displays no seizure activity. Coordination and gait normal. GCS eye subscore is 4. GCS verbal subscore is 5. GCS motor subscore is 6.  5/5 bilateral hand grasp/extremity strength equal; ambulates in hallway to bathroom without difficulty sure and steady  Skin: Skin is warm and dry. Capillary refill takes less than 2 seconds. Abrasion and rash noted. No bruising, no burn, no ecchymosis, no laceration, no lesion, no petechiae and no purpura noted. Rash is macular, papular and maculopapular. Rash is not nodular, not pustular, not vesicular and not urticarial. She is not diaphoretic. No cyanosis or erythema. No pallor. Nails show no clubbing.     6 papules in linear pattern left axilla erythematous dry; right shin macular erythema 5x7cm with linear abrasions less than 1cm length  across macular erythema skin dry  Psychiatric: She has a normal mood and affect. Her speech is normal and behavior is normal. Judgment and thought content normal. She is not actively hallucinating. Cognition and memory are normal. She is attentive.  Nursing note and vitals reviewed.   Urgent Care Course   Clinical Course    Procedures (including critical care time)  Labs Review Labs Reviewed  CBC WITH DIFFERENTIAL/PLATELET  BASIC METABOLIC PANEL    Imaging Review No results found.  Orthostatic VS for the past 24 hrs:  BP- Lying Pulse- Lying BP- Sitting Pulse- Sitting BP- Standing at 0 minutes Pulse- Standing at 0 minutes  06/26/16 1906 120/73 58 (!) 136/91 75 (!) 149/91 82  06/26/16 1717 123/89 78 (!) 125/91 72 (!) 133/93 102   1725 patient ambulated to bathroom and voided without difficulty after  discussing orthostatic vital signs show mild dehydration with patient.  Discussed rehydration options and she preferred po to IV at this time.  Patient verbalized understanding of information and had no further questions at this time.  1730 patient changed her mind and wants IV rehydration.  Discussed zofran and po rehydration and patient refused.  1745 heplock started by RN Alphonzo Lemmings and NS bolus IV initiated  1815 NS IV infusing without difficulty/pain patient resting on exam table lying down calm/quiet denied dizzyness  1850 infused patient reported still having dizziness with lying to sitting position changes.  Ambulated to bathroom gait sure and steady for void clear yellow urine.  Repeat orthostatics pending.  Orthostatic VS for the past 24 hrs (Last 3 readings):  BP- Lying Pulse- Lying BP- Sitting Pulse- Sitting BP- Standing at 0 minutes Pulse- Standing at 0 minutes  06/26/16 1906 120/73 58 (!) 136/91 75 (!) 149/91 82  06/26/16 1717 123/89 78 (!) 125/91 72 (!) 133/93 102   1906 NS infusion completed and heplock discontinued.  Pending CBC/BMP results.   Discussed with patient fluid in middle ear could lead to dizziness; vital signs improved from initial.  Patient refused meclizine offer to see if improves dizziness stated dramamine OTC knocks me out.  Patient to continue po hydration.  Offered snack patient refused at this time.  Discussed if anemia found recommend woman's multivitamin with iron daily and follow up with PCM/GYN consider changing birth control method discussed mirena IUD.  Patient verbalized understanding information/instructions, agreed with plan of care and had no further questions at this time.  1950 discussed lab results with patient and significant other negative for infection, anemia, electrolyte imbalance, kidney function disturbance. Offered copy of lab results patient refused.  Compared current labs to previous at [facility] stable.   Patient and significant other verbalized understanding information and had no further questions at this time.  2000 Heplock discontinued.  Simple dressing applied by RN Alphonzo Lemmings site without edema/erythema/tenderness/bruising or bleeding on discharge ambulatory from clinic. MDM   1. Dehydration   2. Otitis media with effusion, bilateral   3. Dizziness   4. Eczema    Patient may use normal saline nasal spray as needed.  Consider antihistamine claritin 10mg  po daily or flonase 1 spray each nostril bid nasal steroid use.  Avoid triggers if possible.  Shower prior to bedtime if exposed to triggers.  If allergic dust/dust mites recommend mattress/pillow covers/encasements; washing linens, vacuuming, sweeping, dusting weekly.  Call or return to clinic as needed if these symptoms worsen or fail to improve as anticipated.   Exitcare handout on allergic rhinitis given to patient.  Patient verbalized understanding of instructions, agreed with plan of care and had no further questions at this time.  P2:  Avoidance and hand washing.  Supportive treatment.   No evidence of invasive bacterial infection, non  toxic and well hydrated.  This is most likely self limiting viral infection.  I do not see where any further testing or imaging is necessary at this time.   I will suggest supportive care, rest, good hygiene and encourage the patient to take adequate fluids.  The patient is to return to clinic or EMERGENCY ROOM if symptoms worsen or change significantly e.g. ear pain, fever, purulent discharge from ears or bleeding.  Exitcare handout on otitis media with effusion given to patient.  Patient verbalized agreement and understanding of treatment plan.    Discussed with patient otitis media with effusion probably causing vertigo but  could also be age.  Meclizine 25mg  po x 1 helpful in clinic.  Supportive treatment may take up to 4 doses meclizine per day max 100mg  per 24 hours.  Discussed signs/symptoms stroke.  Neighbor to drive her home recommended not driving during vertigo episodes.  Follow up if aphasia, dysphasia, visual changes, weakness, fall, worst headache of life, incoordination, fever, ear discharge.  Consider ENT evaluation/follow up with PCM if worsening symptoms not controlled with meclizine or needs Rx refill.  Patient verbalized understanding of information/agreed with plan of care and had no further questions at this time.  Mild dehydration due to busy at work decreased po intake.  Discussed with patient and significant other to push po fluids (water, broth, gatorade, pedialyte, ginger ale) to ensure urine pale yellow clear, mucous membranes wet/pink no white coating on tongue.   Follow up if lethargy, unable to urinate every 8 hours, brown/cola colored urine.  Exitcare handout on dehydration given to patient.  Patient and significant other verbalized understanding of information/instructions, agreed with plan of care and had no further questions at this time.  Avoid hot long showers.  Apply emollient (vaseline/eucerin/aquaphor ointment at least daily after shower).  Triamcinolone 0.1% topical BID  x 10 days apply sparingly to affected areas wash hands after application.  Rx given.  Avoid scratching.  Monitor diet to see if any food triggers to eczema flares.  Exitcare handout on eczema given to patient.  Medication as directed.  Symptomatic therapy suggested.  Warm to cool water soaks and/or oatmeal baths.  Call or return to clinic as needed if these symptoms worsen or fail to improve as anticipated. Patient verbalized agreement and understanding of treatment plan and had no further questions at this time.   P2:  Avoidance and hand washing.   Barbaraann Barthel, NP 06/26/16 2008

## 2016-06-26 NOTE — ED Triage Notes (Signed)
Patient started feeling light headed today at work after lunch. Patient reports passing out twice this PM.

## 2016-10-31 IMAGING — CR DG TOE 5TH 2+V*R*
3 series · 3 of 3 positions shown · non-contrast
Comparison: None.

CLINICAL DATA: Pain and bruising after hitting toe against solid
object

EXAM:
RIGHT FIFTH TOE:  3 V

[toe ap]
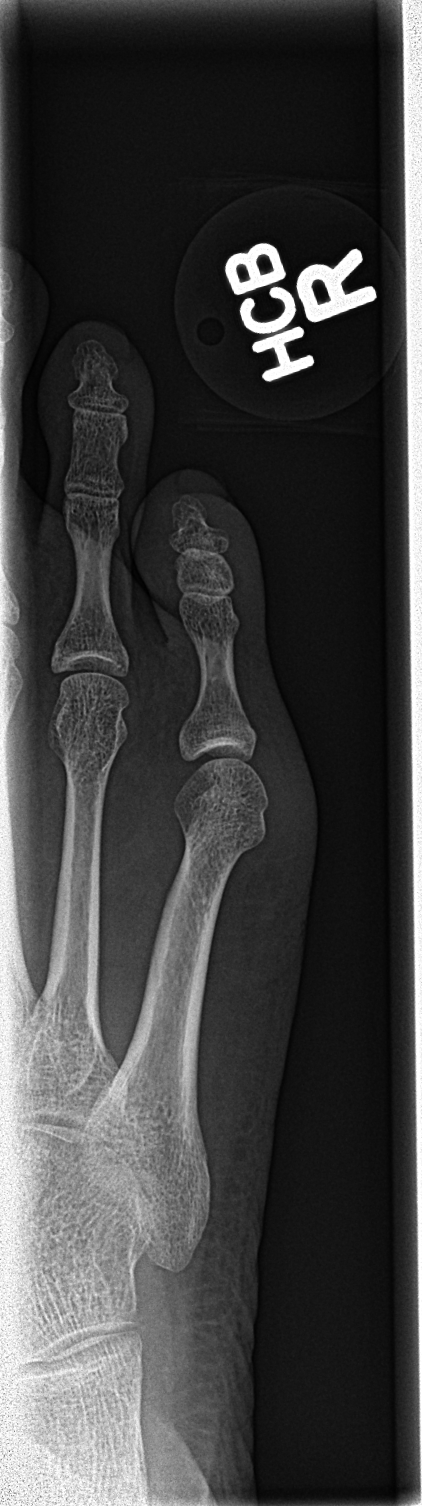

[toe obl]
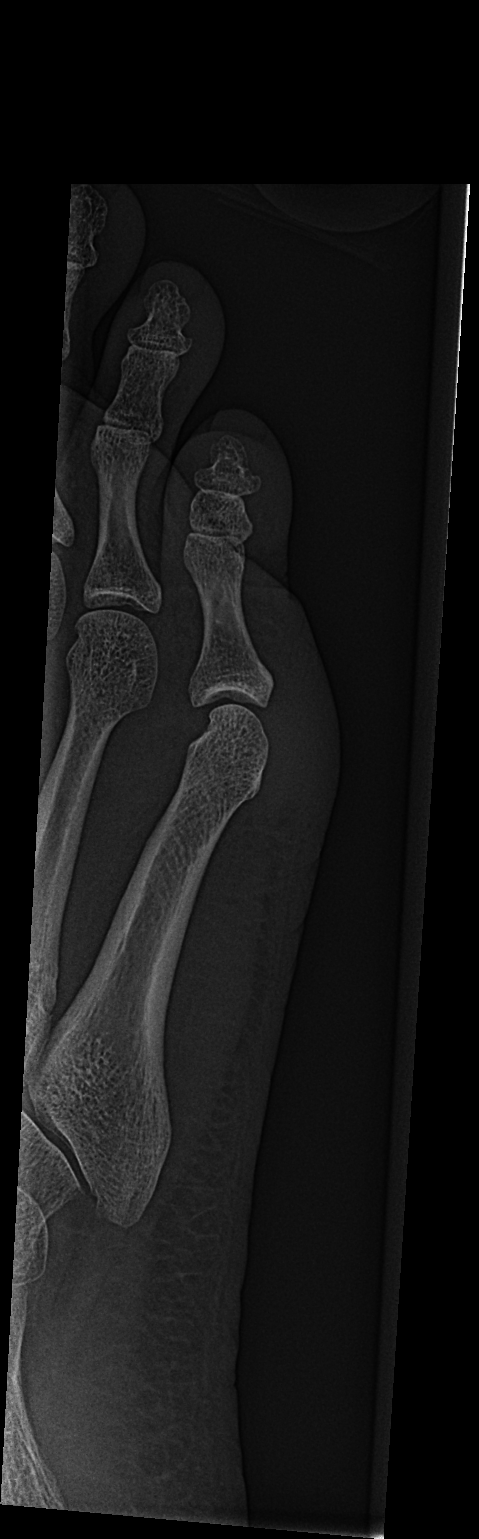

[toe lat]
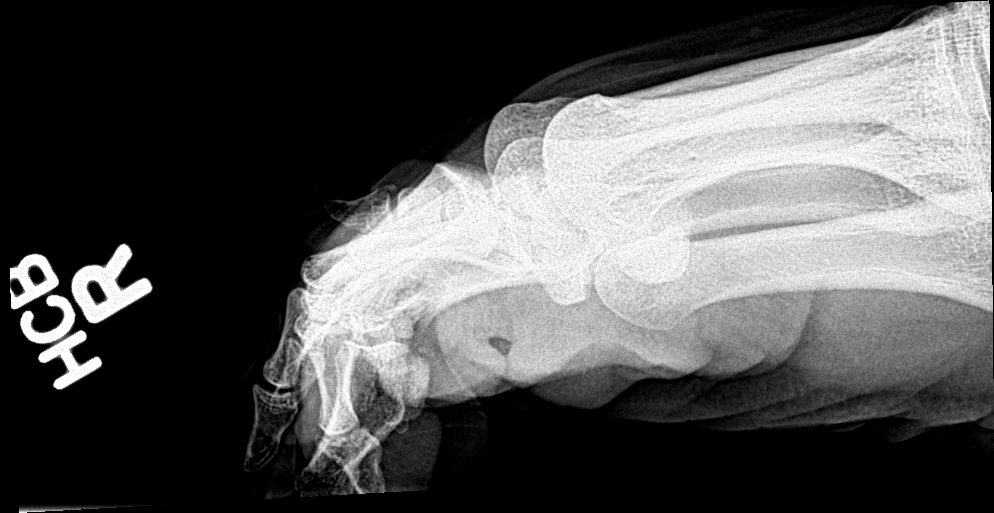

[3 of 3 positions shown; findings below may reference images not displayed]

FINDINGS: Frontal, oblique, and lateral views were obtained. There is a small
avulsion along the dorsal aspect of the proximal portion of the
fifth distal phalanx. No other fracture. No dislocation. The joint
spaces appear normal.
IMPRESSION: Avulsion along the dorsal aspect proximal portion of the fifth
distal phalanx. No other fracture. No dislocation. No apparent
arthropathy.

## 2017-07-07 ENCOUNTER — Ambulatory Visit
Admission: EM | Admit: 2017-07-07 | Discharge: 2017-07-07 | Disposition: A | Discharge status: Home / Self Care | Payer: Worker's Compensation | Attending: Emergency Medicine | Admitting: Emergency Medicine

## 2017-07-07 ENCOUNTER — Encounter: Payer: Self-pay | Admitting: Emergency Medicine

## 2017-07-07 DIAGNOSIS — S39012A Strain of muscle, fascia and tendon of lower back, initial encounter: Secondary | ICD-10-CM

## 2017-07-07 MED ORDER — TRAMADOL HCL 50 MG PO TABS: 50.0000 mg | ORAL_TABLET | Freq: Four times a day (QID) | ORAL | 0 refills | Status: DC | PRN

## 2017-07-07 MED ORDER — DICLOFENAC SODIUM 75 MG PO TBEC: 75.0000 mg | DELAYED_RELEASE_TABLET | Freq: Two times a day (BID) | ORAL | 0 refills | Status: DC

## 2017-07-07 MED ORDER — METHOCARBAMOL 750 MG PO TABS: 750.0000 mg | ORAL_TABLET | ORAL | 0 refills | Status: DC

## 2017-07-07 NOTE — Discharge Instructions (Signed)
Diclofenac Twice a day with 1 g of Tylenol. May take an additional gram of Tylenol 2 more times per day. Tramadol for severe pain only. Do not take the Xanax while taking the tramadol. Use Robaxin as needed for muscle spasms. Follow-up with Kela Millin, occupational health on Monday or Tuesday.
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
## 2018-03-24 ENCOUNTER — Encounter: Payer: Self-pay | Admitting: Obstetrics and Gynecology

## 2018-03-29 ENCOUNTER — Encounter: Payer: Self-pay | Admitting: Obstetrics and Gynecology

## 2018-05-04 ENCOUNTER — Other Ambulatory Visit: Payer: Self-pay

## 2018-05-11 ENCOUNTER — Ambulatory Visit: Payer: Self-pay | Admitting: Urology

## 2018-05-11 ENCOUNTER — Encounter

## 2018-06-06 DIAGNOSIS — N739 Female pelvic inflammatory disease, unspecified: Secondary | ICD-10-CM | POA: Insufficient documentation

## 2018-06-06 DIAGNOSIS — F419 Anxiety disorder, unspecified: Secondary | ICD-10-CM | POA: Insufficient documentation

## 2018-06-06 NOTE — Progress Notes (Deleted)
06/07/2018 10:02 PM   Tanya Patterson Apr 25, 1986 [id number]  Referring provider: Eda Paschal, MD [address], [place] [address]  No chief complaint on file.   HPI: Patient is a 32 year old Caucasian female who is referred by Threasa Alpha, FNP for recurrent UTI's and kidney stones.    Patient states that she has had *** urinary tract infections over the last year.  Reviewing her records,  she has had no documented UTI's.     Her symptoms with a urinary tract infection consist of ***.  She denies/endorses dysuria, gross hematuria, suprapubic pain, back pain, abdominal pain or flank pain associated with UTI's.    She has not had any recent fevers, chills, nausea or vomiting associated with UTI's.   She does/does not have a history of nephrolithiasis, GU surgery or GU trauma. ***  She is/is not sexually active.  She has/has not noted a correlation with her urinary tract infections and sexual intercourse.  ***   She does/does not engage in anal sex. ***  She is/ is not having anal to vaginal sex.*** She is/is not voiding before and after sex. ***     She is/is not postmenopausal. ***  She admits to/denies constipation and/or diarrhea. ***  She does/does not use tampons.  She does/does not engage in good perineal hygiene. She does/does not take tub baths. ***  She has/does not have incontinence.  She is using incontinence pads. ***  She is having/ not having pain with bladder filling.  ***  Contrast CT in June 2019 was negative for stones.    She is drinking *** of water daily.     PMH: Past Medical History:  Diagnosis Date  . Anxiety   . Eczema     Surgical History: Past Surgical History:  Procedure Laterality Date  . WISDOM TOOTH EXTRACTION      Home Medications:  Allergies as of 06/07/2018      Reactions   Blueberry Flavor Swelling      Medication List        Accurate as of 06/06/18 10:02 PM. Always use your most recent med  list.          ALPRAZOLAM PO Take 0.5 mg by mouth as needed.   diclofenac 75 MG EC tablet Commonly known as:  VOLTAREN Take 1 tablet (75 mg total) by mouth 2 (two) times daily. Take with food   etonogestrel-ethinyl estradiol 0.12-0.015 MG/24HR vaginal ring Commonly known as:  [place] 1 each vaginally every 28 (twenty-eight) days. Insert vaginally and leave in place for 3 consecutive weeks, then remove for 1 week.   methocarbamol 750 MG tablet Commonly known as:  ROBAXIN Take 1 tablet (750 mg total) by mouth every 4 (four) hours.   traMADol 50 MG tablet Commonly known as:  ULTRAM Take 1 tablet (50 mg total) by mouth every 6 (six) hours as needed.       Allergies:  Allergies  Allergen Reactions  . Blueberry Flavor Swelling    Family History: Family History  Problem Relation Age of Onset  . Hypertension Father     Social History:  reports that she has been smoking cigarettes.  She has been smoking about 0.50 packs per day. She has never used smokeless tobacco. She reports that she drinks alcohol. She reports that she does not use drugs.  ROS:  Physical Exam: There were no vitals taken for this visit.  Constitutional:  Well nourished. Alert and oriented, No acute distress. HEENT: [place] AT, moist mucus membranes.  Trachea midline, no masses. Cardiovascular: No clubbing, cyanosis, or edema. Respiratory: Normal respiratory effort, no increased work of breathing. GI: Abdomen is soft, non tender, non distended, no abdominal masses. Liver and spleen not palpable.  No hernias appreciated.  Stool sample for occult testing is not indicated.   GU: No CVA tenderness.  No bladder fullness or masses.  Normal external genitalia, normal pubic hair distribution, no lesions.  Normal urethral meatus, no lesions, no prolapse, no discharge.   No urethral masses, tenderness and/or tenderness. No bladder fullness, tenderness  or masses. Normal vagina mucosa, good estrogen effect, no discharge, no lesions, good pelvic support, no cystocele or rectocele noted.  No cervical motion tenderness.  Uterus is freely mobile and non-fixed.  No adnexal/parametria masses or tenderness noted.  Anus and perineum are without rashes or lesions.   *** Skin: No rashes, bruises or suspicious lesions. Lymph: No cervical or inguinal adenopathy. Neurologic: Grossly intact, no focal deficits, moving all 4 extremities. Psychiatric: Normal mood and affect.  Laboratory Data: Lab Results  Component Value Date   WBC 6.7 06/26/2016   HGB 14.7 06/26/2016   HCT 43.2 06/26/2016   MCV 88.9 06/26/2016   PLT 228 06/26/2016    Lab Results  Component Value Date   CREATININE 0.64 06/26/2016    No results found for: PSA  No results found for: TESTOSTERONE  No results found for: HGBA1C  No results found for: TSH  No results found for: CHOL, HDL, CHOLHDL, VLDL, LDLCALC  Lab Results  Component Value Date   AST 14 (L) 12/30/2013   Lab Results  Component Value Date   ALT 24 12/30/2013   No components found for: ALKALINEPHOPHATASE No components found for: BILIRUBINTOTAL  No results found for: ESTRADIOL  Urinalysis    Component Value Date/Time   COLORURINE AMBER (A) 03/09/2016 1944   APPEARANCEUR HAZY (A) 03/09/2016 1944   APPEARANCEUR HAZY 04/10/2014 1837   LABSPEC >1.030 (H) 03/09/2016 1944   LABSPEC 1.010 04/10/2014 1837   PHURINE 6.0 03/09/2016 1944   GLUCOSEU NEGATIVE 03/09/2016 1944   GLUCOSEU NEGATIVE 04/10/2014 1837   HGBUR NEGATIVE 03/09/2016 1944   BILIRUBINUR 1+ (A) 03/09/2016 1944   BILIRUBINUR NEGATIVE 04/10/2014 1837   KETONESUR 3+ (A) 03/09/2016 1944   PROTEINUR 100 (A) 03/09/2016 1944   NITRITE POSITIVE (A) 03/09/2016 1944   LEUKOCYTESUR NEGATIVE 03/09/2016 1944   LEUKOCYTESUR 1+ 04/10/2014 1837    I have reviewed the labs.   Pertinent Imaging: Result Impression  --Mild wall thickening versus  underdistention involving the cecum and ascending colon. In the appropriate clinical setting, this may represent mild colitis. --Normal appendix. 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 No hydronephrosis or obstructing lesion identified. No perinephric stranding to suggest infection. Correlate with urinalysis.  Result Narrative  EXAM: CT ABDOMEN PELVIS W CONTRAST DATE: 04/19/2018 2:49 PM ACCESSION: [id number] UN DICTATED: 04/19/2018 2:54 PM INTERPRETATION LOCATION: [place]: 32 years old Female with [id number] abdominal pain  COMPARISON: CT 11/16/2008  TECHNIQUE: A spiral CT scan was obtained with IV contrast from the lung bases to the pubic symphysis.Images were reconstructed in the axial plane. Coronal and sagittal reformatted images were also provided for further evaluation.  FINDINGS:  LINES/DEVICES: None.  LOWER CHEST: Unremarkable.  ABDOMEN/PELVIS  HEPATOBILIARY: Unremarkable liver. No biliary  ductal dilatation. Gallbladderis unremarkable. PANCREAS: Unremarkable. SPLEEN: Unremarkable. ADRENAL GLANDS: Unremarkable. KIDNEYS/URETERS: Incidental note is made of a partially duplicated collecting system in the right kidney with mild associated hydroureter of the upper and lower pole ureters. No hydronephrosis. Unremarkable left kidney. BLADDER: Unremarkable. BOWEL/PERITONEUM/RETROPERITONEUM: No bowel obstruction. Normal appendix. Wall thickening versus underdistention involving the cecum and ascending colon.. No ascites. VASCULATURE: Abdominal aorta within normal limits for patient's age. Unremarkable inferior vena cava. LYMPH NODES: No adenopathy. REPRODUCTIVE ORGANS: Unremarkable uterus and ovaries. Contraceptive ring noted in the vagina.  BONES/SOFT TISSUES: Unremarkable.    Assessment & Plan:  ***  1.  - criteria for recurrent UTI has been met with 2 or more infections  in 6 months or 3 or greater infections in one year ***  - patient is instructed to increase their water intake until the urine is pale yellow or clear (10 to 12 cups daily) ***  - patient is instructed to take probiotics (yogurt, oral pills or vaginal suppositories), take cranberry pills or drink the juice and Vitamin C 1,000 mg daily to acidify the urine ***  - if using tampons, she should remove them prior to urinating and change them often ***  - avoid soaking in tubs and wipe front to back after urinating ***  - benefit from core strengthening exercises has been seen.  We can refer to PT if they desire ***  - advised them to have CATH UA's for urinalysis and culture to prevent skin, vaginal and/or rectal contamination of the specimen  - reviewed symptoms of UTI (fevers, chills, gross hematuria, mental status changes, dysuria, suprapubic pain, back pain and/or sudden worsening of urinary symptoms) and advised not to have urine checked or be treated for UTI if not experiencing symptoms  - discussed antibiotic stewardship with the patient - explained the risk of increasing risk of antibiotic resistance with continuous exposure to antibiotics, renal failure, hypoglycemia, C. Diff infection, allergic reactions, etc.    2. Duplicated collecting system of right kidney ***                                                   No follow-ups on file.  These notes generated with voice recognition software. I apologize for typographical errors.  Zara Council, PA-C  [facility] [address]  [place], [place] [address] [phone]

## 2018-06-07 ENCOUNTER — Ambulatory Visit: Payer: BLUE CROSS/BLUE SHIELD | Admitting: Urology

## 2018-06-07 ENCOUNTER — Ambulatory Visit: Payer: Self-pay | Admitting: Urology

## 2018-06-07 ENCOUNTER — Encounter: Payer: Self-pay | Admitting: Urology

## 2018-09-09 ENCOUNTER — Encounter: Payer: Self-pay | Admitting: Obstetrics and Gynecology

## 2018-09-09 ENCOUNTER — Ambulatory Visit (INDEPENDENT_AMBULATORY_CARE_PROVIDER_SITE_OTHER): Payer: BLUE CROSS/BLUE SHIELD | Admitting: Obstetrics and Gynecology

## 2018-09-09 VITALS — BP 122/84 | HR 111 | Ht 61.0 in | Wt 159.0 lb

## 2018-09-09 DIAGNOSIS — Z308 Encounter for other contraceptive management: Secondary | ICD-10-CM

## 2018-09-09 NOTE — Progress Notes (Signed)
Obstetrics & Gynecology Office Visit   Chief Complaint:  Chief Complaint  Patient presents with  . Surgery consult    Discuss Tubal Ligation    History of Present Illness: 32 year old [id number] presenting for contraception consult.  Patient is interested in proceeding with tubal ligation.  She is certain she is done with child bearing.  No menstrual concerns.  No history of prior anesthesia complicatios or contra-indications to surgery.   Review of Systems: Review of Systems  All other systems reviewed and are negative.    Past Medical History:  Past Medical History:  Diagnosis Date  . Anxiety   . Eczema     Past Surgical History:  Past Surgical History:  Procedure Laterality Date  . WISDOM TOOTH EXTRACTION      Gynecologic History: Patient's last menstrual period was 08/28/2018 (exact date).  Obstetric History: [id number]  Family History:  Family History  Problem Relation Age of Onset  . Hypertension Father     Social History:  Social History   Socioeconomic History  . Marital status: Single    Spouse name: Not on file  . Number of children: Not on file  . Years of education: Not on file  . Highest education level: Not on file  Occupational History  . Not on file  Social Needs  . Financial resource strain: Not on file  . Food insecurity:    Worry: Not on file    Inability: Not on file  . Transportation needs:    Medical: Not on file    Non-medical: Not on file  Tobacco Use  . Smoking status: Current Every Day Smoker    Packs/day: 0.50    Types: Cigarettes  . Smokeless tobacco: Never Used  Substance and Sexual Activity  . Alcohol use: Yes    Comment: social  . Drug use: No  . Sexual activity: Yes    Birth control/protection: Inserts  Lifestyle  . Physical activity:    Days per week: Not on file    Minutes per session: Not on file  . Stress: Not on file  Relationships  . Social connections:    Talks on phone: Not on file    Gets  together: Not on file    Attends religious service: Not on file    Active member of club or organization: Not on file    Attends meetings of clubs or organizations: Not on file    Relationship status: Not on file  . Intimate partner violence:    Fear of current or ex partner: Not on file    Emotionally abused: Not on file    Physically abused: Not on file    Forced sexual activity: Not on file  Other Topics Concern  . Not on file  Social History Narrative  . Not on file    Allergies:  Allergies  Allergen Reactions  . Blueberry Flavor Swelling    Medications: Prior to Admission medications   Medication Sig Start Date End Date Taking? Authorizing Provider  ALPRAZOLAM PO Take 0.5 mg by mouth as needed.    Yes [provider]  etonogestrel-ethinyl estradiol (NUVARING) 0.12-0.015 MG/24HR vaginal ring Place 1 each vaginally every 28 (twenty-eight) days. Insert vaginally and leave in place for 3 consecutive weeks, then remove for 1 week.   Yes [provider]    Physical Exam Vitals:  Vitals:   09/09/18 1357  BP: 122/84  Pulse: (!) 111   Patient's last menstrual period was  08/28/2018 (exact date).  General: NAD HEENT: normocephalic, anicteric Pulmonary: No increased work of breathing Extremities: no edema, erythema, or tenderness Neurologic: Grossly intact Psychiatric: mood appropriate, affect full  Female chaperone present for pelvic  portions of the physical exam  Assessment: 32 y.o. [id number] interested in tubal ligation  Plan: .- Post BTL in December (salpingectomy) - surgery request sent.  32 y.o. [id number]  with undesired fertility, desires permanent sterilization.  Other reversible forms of contraception were discussed with patient; she declines all other modalities. Permanent nature of as well as associated risks of the procedure discussed with patient including but not limited to: risk of regret, permanence of method, bleeding, infection, injury to  surrounding organs and need for additional procedures.  Failure risk of 0.5-1% with increased risk of ectopic gestation if pregnancy occurs was also discussed with patient.   - discussed pros and cons of occlusive procedures vs salpingectomy - Review indicates need for preoperative pap   A total of 20 minutes were spent in face-to-face contact with the patient during this encounter with over half of that time devoted to counseling and coordination of care.  Return if symptoms worsen or fail to improve.   Vena Austria, MD, [facility], [facility] 09/09/2018, 2:13 PM

## 2018-09-13 ENCOUNTER — Telehealth: Payer: Self-pay | Admitting: Obstetrics and Gynecology

## 2018-09-13 NOTE — Telephone Encounter (Signed)
-----   Message from Vena Austria, MD sent at 09/13/2018  9:43 AM EDT ----- Regarding: Surgery Surgery Date: Patient preference  LOS: same day surgery  Surgery Booking Request Patient Full Name: Tanya Patterson MRN: [id number]  DOB: 07/31/1986  Surgeon: Vena Austria, MD  Requested Surgery Date and Time: 09/13/18 Primary Diagnosis and Code: Undesired fertility Secondary Diagnosis and Code:  Surgical Procedure: Laparoscopic bilateral salpingectomy L&D Notification:N/A Admission Status: same day surgery Length of Surgery: 1hr Special Case Needs: none H&P:  (date) Phone Interview or Office Pre-Admit: pre-admit Interpreter: No Language: English Medical Clearance: No Special Scheduling Instructions: none

## 2018-09-13 NOTE — Telephone Encounter (Signed)
Patient is calling to follow up on her request for surgery. Please advise

## 2018-09-13 NOTE — Telephone Encounter (Signed)
Patient aware of H&P day of surgery and Pre-admit Testing phone interview to be scheduled, per Dr Bonney Aid, and OR on 10/27/18. Patient is aware to call for arrival time on the afternoon before surgery. Patient is aware she may receive calls from the [facility] and [facility]. Patient confirmed BCBS and no secondary insurance. Per patient, she has already contacted [employer] and was told the surgery would be covered. [employer] info discussed, including deductible, coinsurance, and out of pocket accumulations. No authorization required. Okay to leave detailed messages regarding surgery on voicemail, per patient. Ext given.

## 2018-09-14 NOTE — Telephone Encounter (Signed)
Should be getting a call from [place]

## 2018-10-19 ENCOUNTER — Inpatient Hospital Stay: Admission: RE | Admit: 2018-10-19 | Payer: Self-pay | Source: Ambulatory Visit

## 2018-10-20 ENCOUNTER — Encounter
Admission: RE | Admit: 2018-10-20 | Discharge: 2018-10-20 | Disposition: A | Discharge status: Home / Self Care | Payer: BLUE CROSS/BLUE SHIELD | Source: Ambulatory Visit | Attending: Obstetrics and Gynecology | Admitting: Obstetrics and Gynecology

## 2018-10-20 ENCOUNTER — Other Ambulatory Visit: Payer: Self-pay

## 2018-10-20 HISTORY — DX: Headache, unspecified: R51.9

## 2018-10-20 HISTORY — DX: Headache: R51

## 2018-10-20 HISTORY — DX: Personal history of urinary calculi: Z87.442

## 2018-10-20 NOTE — Patient Instructions (Signed)
Your procedure is scheduled on: 10-27-18 Report to Same Day Surgery 2nd floor medical mall [facility](Medical Mall Entrance-take elevator on left to 2nd floor.  Check in with surgery information desk.) To find out your arrival time please call [phone] between 1PM - 3PM on 10-26-18  Remember: Instructions that are not followed completely may result in serious medical risk, up to and including death, or upon the discretion of your surgeon and anesthesiologist your surgery may need to be rescheduled.    _x___ 1. Do not eat food after midnight the night before your procedure. You may drink clear liquids up to 2 hours before you are scheduled to arrive at the hospital for your procedure.  Do not drink clear liquids within 2 hours of your scheduled arrival to the hospital.  Clear liquids include  --Water or Apple juice without pulp  --Clear carbohydrate beverage such as ClearFast or Gatorade  --Black Coffee or Clear Tea (No milk, no creamers, do not add anything to  the coffee or Tea   ____Ensure clear carbohydrate drink on the way to the hospital for bariatric patients  ____Ensure clear carbohydrate drink 3 hours before surgery for Dr Rutherford NailByrnett's patients if physician instructed.   No gum chewing or hard candies.     __x__ 2. No Alcohol for 24 hours before or after surgery.   __x__3. No Smoking or e-cigarettes for 24 prior to surgery.  Do not use any chewable tobacco products for at least 6 hour prior to surgery   ____  4. Bring all medications with you on the day of surgery if instructed.    __x__ 5. Notify your doctor if there is any change in your medical condition     (cold, fever, infections).    x___6. On the morning of surgery brush your teeth with toothpaste and water.  You may rinse your mouth with mouth wash if you wish.  Do not swallow any toothpaste or mouthwash.   Do not wear jewelry, make-up, hairpins, clips or nail polish.  Do not wear lotions, powders, or perfumes. You may wear  deodorant.  Do not shave 48 hours prior to surgery. Men may shave face and neck.  Do not bring valuables to the hospital.    [facility] is not responsible for any belongings or valuables.               Contacts, dentures or bridgework may not be worn into surgery.  Leave your suitcase in the car. After surgery it may be brought to your room.  For patients admitted to the hospital, discharge time is determined by your treatment team.  _  Patients discharged the day of surgery will not be allowed to drive home.  You will need someone to drive you home and stay with you the night of your procedure.    Please read over the following fact sheets that you were given:   [facility] Pre[facility]   ____ Take anti-hypertensive listed below, cardiac, seizure, asthma, anti-reflux and psychiatric medicines. These include:  1. YOU MAY TAKE YOUR XANAX DAY OF SURGERY IF NEEDED WITH A SMALL SIP OF WATER  2.  3.  4.  5.  6.  ____Fleets enema or Magnesium Citrate as directed.   ____ Use CHG Soap or sage wipes as directed on instruction sheet   ____ Use inhalers on the day of surgery and bring to hospital day of surgery  ____ Stop Metformin and Janumet 2 days prior to surgery.  ____ Take 1/2 of usual insulin dose the night before surgery and none on the morning surgery.   ____ Follow recommendations from Cardiologist, Pulmonologist or PCP regarding stopping Aspirin, Coumadin, Plavix ,Eliquis, Effient, or Pradaxa, and Pletal.  X____Stop Anti-inflammatories such as Advil, Aleve, Ibuprofen, Motrin, Naproxen, Naprosyn, Goodies powders or aspirin products NOW- OK to take Tylenol   ____ Stop supplements until after surgery.    ____ Bring C-Pap to the hospital.

## 2018-10-27 ENCOUNTER — Ambulatory Visit: Payer: BLUE CROSS/BLUE SHIELD | Admitting: Anesthesiology

## 2018-10-27 ENCOUNTER — Ambulatory Visit
Admission: RE | Admit: 2018-10-27 | Discharge: 2018-10-27 | Disposition: A | Discharge status: Home / Self Care | Payer: BLUE CROSS/BLUE SHIELD | Source: Ambulatory Visit | Attending: Obstetrics and Gynecology | Admitting: Obstetrics and Gynecology

## 2018-10-27 ENCOUNTER — Other Ambulatory Visit: Payer: Self-pay

## 2018-10-27 ENCOUNTER — Encounter
Admission: RE | Disposition: A | Discharge status: Home / Self Care | Payer: Self-pay | Source: Ambulatory Visit | Attending: Obstetrics and Gynecology

## 2018-10-27 DIAGNOSIS — Z79899 Other long term (current) drug therapy: Secondary | ICD-10-CM | POA: Insufficient documentation

## 2018-10-27 DIAGNOSIS — F419 Anxiety disorder, unspecified: Secondary | ICD-10-CM | POA: Diagnosis not present

## 2018-10-27 DIAGNOSIS — R51 Headache: Secondary | ICD-10-CM | POA: Insufficient documentation

## 2018-10-27 DIAGNOSIS — I10 Essential (primary) hypertension: Secondary | ICD-10-CM | POA: Diagnosis not present

## 2018-10-27 DIAGNOSIS — F17210 Nicotine dependence, cigarettes, uncomplicated: Secondary | ICD-10-CM | POA: Insufficient documentation

## 2018-10-27 DIAGNOSIS — Z302 Encounter for sterilization: Secondary | ICD-10-CM | POA: Diagnosis present

## 2018-10-27 DIAGNOSIS — Z87442 Personal history of urinary calculi: Secondary | ICD-10-CM | POA: Insufficient documentation

## 2018-10-27 HISTORY — PX: LAPAROSCOPIC BILATERAL SALPINGECTOMY: SHX5889

## 2018-10-27 LAB — POCT PREGNANCY, URINE: Preg Test, Ur: NEGATIVE

## 2018-10-27 SURGERY — SALPINGECTOMY, BILATERAL, LAPAROSCOPIC
Anesthesia: General | Site: Abdomen | Laterality: Bilateral

## 2018-10-27 MED ORDER — ONDANSETRON 4 MG PO TBDP: 4.0000 mg | ORAL_TABLET | Freq: Four times a day (QID) | ORAL | 0 refills | Status: AC | PRN

## 2018-10-27 MED ORDER — FAMOTIDINE 20 MG PO TABS
20.0000 mg | ORAL_TABLET | Freq: Once | ORAL | Status: AC
  Administered 2018-10-27: 20 mg via ORAL

## 2018-10-27 MED ORDER — FENTANYL CITRATE (PF) 100 MCG/2ML IJ SOLN
INTRAMUSCULAR | Status: AC
  Administered 2018-10-27: 50 ug via INTRAVENOUS
  Filled 2018-10-27: qty 2

## 2018-10-27 MED ORDER — MIDAZOLAM HCL 2 MG/2ML IJ SOLN
INTRAMUSCULAR | Status: AC
  Filled 2018-10-27: qty 2

## 2018-10-27 MED ORDER — FAMOTIDINE 20 MG PO TABS
ORAL_TABLET | ORAL | Status: AC
  Administered 2018-10-27: 20 mg via ORAL
  Filled 2018-10-27: qty 1

## 2018-10-27 MED ORDER — ACETAMINOPHEN NICU IV SYRINGE 10 MG/ML
INTRAVENOUS | Status: AC
  Filled 2018-10-27: qty 1

## 2018-10-27 MED ORDER — IBUPROFEN 600 MG PO TABS: 600.0000 mg | ORAL_TABLET | Freq: Four times a day (QID) | ORAL | 3 refills | Status: AC | PRN

## 2018-10-27 MED ORDER — SUCCINYLCHOLINE CHLORIDE 20 MG/ML IJ SOLN
INTRAMUSCULAR | Status: AC
  Filled 2018-10-27: qty 1

## 2018-10-27 MED ORDER — PROMETHAZINE HCL 25 MG/ML IJ SOLN
INTRAMUSCULAR | Status: AC
  Administered 2018-10-27: 6.25 mg via INTRAVENOUS
  Filled 2018-10-27: qty 1

## 2018-10-27 MED ORDER — DEXAMETHASONE SODIUM PHOSPHATE 10 MG/ML IJ SOLN
INTRAMUSCULAR | Status: AC
  Filled 2018-10-27: qty 1

## 2018-10-27 MED ORDER — ONDANSETRON HCL 4 MG/2ML IJ SOLN
INTRAMUSCULAR | Status: DC | PRN
  Administered 2018-10-27: 4 mg via INTRAVENOUS

## 2018-10-27 MED ORDER — FENTANYL CITRATE (PF) 250 MCG/5ML IJ SOLN
INTRAMUSCULAR | Status: AC
Start: 2018-10-27 — End: ?
  Filled 2018-10-27: qty 5

## 2018-10-27 MED ORDER — SUGAMMADEX SODIUM 200 MG/2ML IV SOLN
INTRAVENOUS | Status: DC | PRN
  Administered 2018-10-27: 120 mg via INTRAVENOUS

## 2018-10-27 MED ORDER — DEXMEDETOMIDINE HCL IN NACL 200 MCG/50ML IV SOLN
INTRAVENOUS | Status: DC | PRN
  Administered 2018-10-27 (×2): 4 ug via INTRAVENOUS

## 2018-10-27 MED ORDER — HYDROMORPHONE HCL 1 MG/ML IJ SOLN
INTRAMUSCULAR | Status: DC | PRN
  Administered 2018-10-27: .7 mg via INTRAVENOUS

## 2018-10-27 MED ORDER — PROMETHAZINE HCL 25 MG/ML IJ SOLN
6.2500 mg | Freq: Once | INTRAMUSCULAR | Status: AC
  Administered 2018-10-27: 6.25 mg via INTRAVENOUS

## 2018-10-27 MED ORDER — MIDAZOLAM HCL 2 MG/2ML IJ SOLN
INTRAMUSCULAR | Status: DC | PRN
  Administered 2018-10-27: 2 mg via INTRAVENOUS

## 2018-10-27 MED ORDER — LACTATED RINGERS IV SOLN
INTRAVENOUS | Status: DC
  Administered 2018-10-27 (×2): via INTRAVENOUS

## 2018-10-27 MED ORDER — SODIUM CHLORIDE FLUSH 0.9 % IV SOLN
INTRAVENOUS | Status: AC
  Filled 2018-10-27: qty 10

## 2018-10-27 MED ORDER — LIDOCAINE HCL (CARDIAC) PF 100 MG/5ML IV SOSY
PREFILLED_SYRINGE | INTRAVENOUS | Status: DC | PRN
  Administered 2018-10-27: 100 mg via INTRAVENOUS

## 2018-10-27 MED ORDER — KETOROLAC TROMETHAMINE 30 MG/ML IJ SOLN
INTRAMUSCULAR | Status: AC
  Filled 2018-10-27: qty 1

## 2018-10-27 MED ORDER — PROPOFOL 10 MG/ML IV BOLUS
INTRAVENOUS | Status: DC | PRN
  Administered 2018-10-27: 50 mg via INTRAVENOUS
  Administered 2018-10-27: 150 mg via INTRAVENOUS

## 2018-10-27 MED ORDER — ACETAMINOPHEN 10 MG/ML IV SOLN
INTRAVENOUS | Status: DC | PRN
  Administered 2018-10-27: 1000 mg via INTRAVENOUS

## 2018-10-27 MED ORDER — BUPIVACAINE HCL (PF) 0.5 % IJ SOLN
INTRAMUSCULAR | Status: AC
  Filled 2018-10-27: qty 30

## 2018-10-27 MED ORDER — ROCURONIUM BROMIDE 50 MG/5ML IV SOLN
INTRAVENOUS | Status: AC
  Filled 2018-10-27: qty 1

## 2018-10-27 MED ORDER — ONDANSETRON HCL 4 MG/2ML IJ SOLN
INTRAMUSCULAR | Status: AC
  Filled 2018-10-27: qty 2

## 2018-10-27 MED ORDER — FENTANYL CITRATE (PF) 100 MCG/2ML IJ SOLN
INTRAMUSCULAR | Status: DC | PRN
  Administered 2018-10-27: 50 ug via INTRAVENOUS

## 2018-10-27 MED ORDER — LIDOCAINE HCL (PF) 2 % IJ SOLN
INTRAMUSCULAR | Status: AC
  Filled 2018-10-27: qty 10

## 2018-10-27 MED ORDER — KETOROLAC TROMETHAMINE 30 MG/ML IJ SOLN
INTRAMUSCULAR | Status: DC | PRN
  Administered 2018-10-27: 30 mg via INTRAVENOUS

## 2018-10-27 MED ORDER — OXYCODONE HCL 5 MG/5ML PO SOLN: 5.0000 mg | Freq: Once | ORAL | Status: DC | PRN

## 2018-10-27 MED ORDER — SUCCINYLCHOLINE CHLORIDE 20 MG/ML IJ SOLN
INTRAMUSCULAR | Status: DC | PRN
  Administered 2018-10-27: 100 mg via INTRAVENOUS

## 2018-10-27 MED ORDER — SUGAMMADEX SODIUM 200 MG/2ML IV SOLN
INTRAVENOUS | Status: AC
  Filled 2018-10-27: qty 2

## 2018-10-27 MED ORDER — PROPOFOL 10 MG/ML IV BOLUS
INTRAVENOUS | Status: AC
  Filled 2018-10-27: qty 20

## 2018-10-27 MED ORDER — ROCURONIUM BROMIDE 100 MG/10ML IV SOLN
INTRAVENOUS | Status: DC | PRN
  Administered 2018-10-27: 30 mg via INTRAVENOUS

## 2018-10-27 MED ORDER — HYDROMORPHONE HCL 1 MG/ML IJ SOLN
INTRAMUSCULAR | Status: AC
Start: 2018-10-27 — End: ?
  Filled 2018-10-27: qty 1

## 2018-10-27 MED ORDER — DEXAMETHASONE SODIUM PHOSPHATE 10 MG/ML IJ SOLN
INTRAMUSCULAR | Status: DC | PRN
  Administered 2018-10-27: 5 mg via INTRAVENOUS

## 2018-10-27 MED ORDER — FENTANYL CITRATE (PF) 100 MCG/2ML IJ SOLN
25.0000 ug | INTRAMUSCULAR | Status: DC | PRN
  Administered 2018-10-27 (×3): 50 ug via INTRAVENOUS

## 2018-10-27 MED ORDER — OXYCODONE-ACETAMINOPHEN 5-325 MG PO TABS: 1.0000 | ORAL_TABLET | Freq: Four times a day (QID) | ORAL | 0 refills | Status: AC | PRN

## 2018-10-27 MED ORDER — OXYCODONE HCL 5 MG PO TABS: 5.0000 mg | ORAL_TABLET | Freq: Once | ORAL | Status: DC | PRN

## 2018-10-27 SURGICAL SUPPLY — 36 items
ANCHOR TIS RET SYS 235ML (MISCELLANEOUS) IMPLANT
BAG URINE DRAINAGE (UROLOGICAL SUPPLIES) ×3 IMPLANT
BLADE SURG SZ11 CARB STEEL (BLADE) ×3 IMPLANT
CANISTER SUCT 1200ML W/VALVE (MISCELLANEOUS) ×3 IMPLANT
CATH FOLEY 2WAY  5CC 16FR (CATHETERS) ×2
CATH URTH 16FR FL 2W BLN LF (CATHETERS) ×1 IMPLANT
CHLORAPREP W/TINT 26ML (MISCELLANEOUS) ×3 IMPLANT
COVER WAND RF STERILE (DRAPES) ×3 IMPLANT
DERMABOND ADVANCED (GAUZE/BANDAGES/DRESSINGS) ×2
DERMABOND ADVANCED .7 DNX12 (GAUZE/BANDAGES/DRESSINGS) ×1 IMPLANT
GLOVE BIO SURGEON STRL SZ7 (GLOVE) ×3 IMPLANT
GLOVE INDICATOR 7.5 STRL GRN (GLOVE) ×3 IMPLANT
GOWN STRL REUS W/ TWL LRG LVL3 (GOWN DISPOSABLE) ×3 IMPLANT
GOWN STRL REUS W/ TWL XL LVL3 (GOWN DISPOSABLE) IMPLANT
GOWN STRL REUS W/TWL LRG LVL3 (GOWN DISPOSABLE) ×6
GOWN STRL REUS W/TWL XL LVL3 (GOWN DISPOSABLE)
GRASPER SUT TROCAR 14GX15 (MISCELLANEOUS) IMPLANT
IRRIGATION STRYKERFLOW (MISCELLANEOUS) IMPLANT
IRRIGATOR STRYKERFLOW (MISCELLANEOUS)
IV LACTATED RINGERS 1000ML (IV SOLUTION) IMPLANT
KIT PINK PAD W/HEAD ARE REST (MISCELLANEOUS) ×3
KIT PINK PAD W/HEAD ARM REST (MISCELLANEOUS) ×1 IMPLANT
KIT TURNOVER CYSTO (KITS) ×3 IMPLANT
LABEL OR SOLS (LABEL) ×3 IMPLANT
NS IRRIG 500ML POUR BTL (IV SOLUTION) ×3 IMPLANT
PACK GYN LAPAROSCOPIC (MISCELLANEOUS) ×3 IMPLANT
PAD OB MATERNITY 4.3X12.25 (PERSONAL CARE ITEMS) ×3 IMPLANT
PAD PREP 24X41 OB/GYN DISP (PERSONAL CARE ITEMS) ×3 IMPLANT
SCISSORS METZENBAUM CVD 33 (INSTRUMENTS) IMPLANT
SHEARS HARMONIC ACE PLUS 36CM (ENDOMECHANICALS) ×3 IMPLANT
SLEEVE ENDOPATH XCEL 5M (ENDOMECHANICALS) ×3 IMPLANT
SUT MNCRL AB 4-0 PS2 18 (SUTURE) ×3 IMPLANT
SUT VIC AB 2-0 UR6 27 (SUTURE) ×3 IMPLANT
TROCAR ENDO BLADELESS 11MM (ENDOMECHANICALS) ×3 IMPLANT
TROCAR XCEL NON-BLD 5MMX100MML (ENDOMECHANICALS) ×3 IMPLANT
TUBING INSUFFLATION (TUBING) ×3 IMPLANT

## 2018-10-27 NOTE — Op Note (Signed)
Preoperative Diagnosis: 1) 32 y.o. with undesired fertility  Postoperative Diagnosis: 1) 32 y.o. with undesired fertility  Operation Performed: Laparoscopic bilateral tubal ligation via bilateral salpingectomy  Indication: 32 y.o. [id number]  with undesired fertility, desires permanent sterilization.  Other reversible forms of contraception were discussed with patient; she declines all other modalities. Permanent nature of as well as associated risks of the procedure discussed with patient including but not limited to: risk of regret, permanence of method, bleeding, infection, injury to surrounding organs and need for additional procedures.  Failure risk of 0.5-1% with increased risk of ectopic gestation if pregnancy occurs was also discussed with patient.    Surgeon: Vena AustriaAndreas Aleksander Edmiston, MD  Anesthesia: General  Preoperative Antibiotics: none  Estimated Blood Loss: 5mL  IV Fluids: 1L  Urine Output:: 150mL  Drains or Tubes: none  Implants: none  Specimens Removed: bilateral fallopian tubes  Complications: none  Intraoperative Findings: Normal tubes, ovaries, and uterus.  Normal appendix, normal liver edge.  Patient Condition: stable  Procedure in Detail:  Patient was taken to the operating room where she was administered general anesthesia.  She was positioned in the dorsal lithotomy position utilizing Allen stirups, prepped and draped in the usual sterile fashion.  Prior to proceeding with procedure a time out was performed.  Attention was turned to the patient's pelvis.  A red rubber catheter was used to empty the patient's bladder.  An operative speculum was placed to allow visualization of the cervix.  The anterior lip of the cervix was grasped with a single tooth tenaculum, and a H[place]ka tenaculum was placed to allow manipulation of the uterus.  The operative speculum and single tooth tenaculum were then removed.  Attention was turned to the patient's abdomen.  The umbilicus was  infiltrated with 1% Sensorcaine, before making a stab incision using an 11 blade scalpel.  A 5mm Excel trocar was then used to gain direct entry into the peritoneal cavity utilizing the camera to visualize progress of the trocar during placement.  Once peritoneal entry had been achieved, insufflation was started and pneumoperitoneum established at a pressure of 15mmHg.   General inspection of the abdomen revealed the above noted findings.  A spot 2cm midline above the pubic symphysis was injected with 1% Sensorcaine and a stab incision was made using an 11 blade scalpel.  A second 5mm falope Excel trocar was place suprapubic through this incision under direct visualization.  A left lower quadrant trocar was placed in similar fashion.  The right tube was identified and grasped its fimbriated end and transected from its attachments to the ovary, mesosalpinx, and uterus using a 5mm harmonic.  Pedicles were inspected and hemostatic.  This procedure was then repeated for the patient's left fallopian tube.  The tubes were removed through the left lower quadrant 5mm trocar and sent to pathology.  Pneumoperitoneum was evacuated.  The trocars were removed.  All trocar sites were then dressed with surgical skin glue.  The Hulka tenaculum was removed.  Sponge needle and instrument counts were correct time two.  The patient tolerated the procedure well and was taken to the recovery room in stable condition.

## 2018-10-27 NOTE — Transfer of Care (Signed)
Immediate Anesthesia Transfer of Care Note  Patient: Tanya Patterson  Procedure(s) Performed: LAPAROSCOPIC BILATERAL SALPINGECTOMY (Bilateral Abdomen)  Patient Location: PACU  Anesthesia Type:General  Level of Consciousness: sedated  Airway & Oxygen Therapy: Patient Spontanous Breathing and Patient connected to face mask oxygen  Post-op Assessment: Report given to RN and Post -op Vital signs reviewed and stable  Post vital signs: Reviewed and stable  Last Vitals:  Vitals Value Taken Time  BP 102/64 10/27/2018  3:43 PM  Temp 36.4 C 10/27/2018  3:43 PM  Pulse 76 10/27/2018  3:43 PM  Resp 16 10/27/2018  3:43 PM  SpO2 100 % 10/27/2018  3:43 PM    Last Pain:  Vitals:   10/27/18 1302  TempSrc: Oral  PainSc: 0-No pain         Complications: No apparent anesthesia complications

## 2018-10-27 NOTE — Anesthesia Procedure Notes (Signed)
Procedure Name: Intubation Date/Time: 10/27/2018 2:45 PM Performed by: Justus Memory, CRNA Pre-anesthesia Checklist: Patient identified, Patient being monitored, Timeout performed, Emergency Drugs available and Suction available Patient Re-evaluated:Patient Re-evaluated prior to induction Oxygen Delivery Method: Circle system utilized Preoxygenation: Pre-oxygenation with 100% oxygen Induction Type: IV induction Ventilation: Mask ventilation without difficulty Laryngoscope Size: Mac and 3 Grade View: Grade I Tube type: Oral Tube size: 7.0 mm Number of attempts: 1 Airway Equipment and Method: Stylet Placement Confirmation: ETT inserted through vocal cords under direct vision,  positive ETCO2 and breath sounds checked- equal and bilateral Secured at: 21 cm Tube secured with: Tape Dental Injury: Teeth and Oropharynx as per pre-operative assessment

## 2018-10-27 NOTE — OR Nursing (Signed)
Spoke with Dr Chauncey CruelStabler via A Helton RN  In OR,  patient may leave Nuva Ring in during procedure.

## 2018-10-27 NOTE — H&P (Signed)
Obstetrics & Gynecology Surgery H&P    Chief Complaint: Scheduled Surgery   History of Present Illness: Patient is a 32 y.o. [id number] presenting for scheduled surgical sterilization via laparoscopic bilateral salpingectomy.  Prior Treatments prior to proceeding with surgery include: Discussed of alternative contraception methods Preoperative Endometrial biopsy: N/A Preoperative Ultrasound: N/A   Review of Systems:10 point review of systems  Past Medical History:  Past Medical History:  Diagnosis Date  . Anxiety   . Eczema   . Headache    migraines  . History of kidney stones     Past Surgical History:  Past Surgical History:  Procedure Laterality Date  . WISDOM TOOTH EXTRACTION      Family History:  Family History  Problem Relation Age of Onset  . Hypertension Father     Social History:  Social History   Socioeconomic History  . Marital status: Single    Spouse name: Not on file  . Number of children: Not on file  . Years of education: Not on file  . Highest education level: Not on file  Occupational History  . Not on file  Social Needs  . Financial resource strain: Not on file  . Food insecurity:    Worry: Not on file    Inability: Not on file  . Transportation needs:    Medical: Not on file    Non-medical: Not on file  Tobacco Use  . Smoking status: Current Some Day Smoker    Packs/day: 0.00    Years: 10.00    Pack years: 0.00    Types: Cigarettes  . Smokeless tobacco: Never Used  Substance and Sexual Activity  . Alcohol use: Yes    Comment: social  . Drug use: No  . Sexual activity: Yes    Birth control/protection: Inserts  Lifestyle  . Physical activity:    Days per week: Not on file    Minutes per session: Not on file  . Stress: Not on file  Relationships  . Social connections:    Talks on phone: Not on file    Gets together: Not on file    Attends religious service: Not on file    Active member of club or organization: Not on  file    Attends meetings of clubs or organizations: Not on file    Relationship status: Not on file  . Intimate partner violence:    Fear of current or ex partner: Not on file    Emotionally abused: Not on file    Physically abused: Not on file    Forced sexual activity: Not on file  Other Topics Concern  . Not on file  Social History Narrative  . Not on file    Allergies:  Allergies  Allergen Reactions  . Blueberry Flavor Swelling    Medications: Prior to Admission medications   Medication Sig Start Date End Date Taking? Authorizing Provider  ALPRAZolam (XANAX) 0.25 MG tablet Take 0.25 mg by mouth 2 (two) times daily as needed for anxiety.    Yes [provider]  etonogestrel-ethinyl estradiol (NUVARING) 0.12-0.015 MG/24HR vaginal ring Place 1 each vaginally every 28 (twenty-eight) days. Insert vaginally and leave in place for 3 consecutive weeks, then remove for 1 week.   Yes [provider]  triamcinolone cream (KENALOG) 0.1 % Apply 1 application topically daily as needed (rash).   Yes [provider]    Physical Exam Vitals: Blood pressure (!) 138/95, pulse 90, resp. rate 16, height 5\' 1"  (1.549  m), weight 69.4 kg, last menstrual period 08/31/2018, SpO2 98 %. General: NAD HEENT: normocephalic, anicteric Pulmonary: No increased work of breathing, CTAB Cardiovascular: RRR, distal pulses 2+ Abdomen: soft, non-tender Genitourinary: deferred Extremities: no edema, erythema, or tenderness Neurologic: Grossly intact Psychiatric: mood appropriate, affect full  Imaging No results found.  Assessment: 32 y.o. [id number] presenting for scheduled surgical sterilization via laparoscopic bilateral salpingectomy  Plan: 1) 32 y.o. [id number]  with undesired fertility, desires permanent sterilization.  Other reversible forms of contraception were discussed with patient; she declines all other modalities. Permanent nature of as well as associated risks of the  procedure discussed with patient including but not limited to: risk of regret, permanence of method, bleeding, infection, injury to surrounding organs and need for additional procedures.  Failure risk of 0.5-1% with increased risk of ectopic gestation if pregnancy occurs was also discussed with patient.     2) Routine postoperative instructions were reviewed with the patient and her family in detail today including the expected length of recovery and likely postoperative course.  The patient concurred with the proposed plan, giving informed written consent for the surgery today.  Patient instructed on the importance of being NPO after midnight prior to her procedure.  If warranted preoperative prophylactic antibiotics and SCDs ordered on call to the OR to meet SCIP guidelines and adhere to recommendation laid forth in ACOG Practice Bulletin Number 104 May 2009  "Antibiotic Prophylaxis for Gynecologic Procedures".     Vena Austria, MD, [facility], [facility] 10/27/2018, 1:52 PM

## 2018-10-27 NOTE — Anesthesia Preprocedure Evaluation (Signed)
Anesthesia Evaluation  Patient identified by MRN, date of birth, ID band Patient awake    Reviewed: Allergy & Precautions, H&P , NPO status , Patient's Chart, lab work & pertinent test results  History of Anesthesia Complications Negative for: history of anesthetic complications  Airway Mallampati: II  TM Distance: >3 FB Neck ROM: full    Dental  (+) Chipped   Pulmonary neg shortness of breath, Current Smoker,           Cardiovascular Exercise Tolerance: Good (-) angina(-) Past MI and (-) DOE negative cardio ROS       Neuro/Psych  Headaches, PSYCHIATRIC DISORDERS negative psych ROS   GI/Hepatic negative GI ROS, Neg liver ROS, neg GERD  ,  Endo/Other  negative endocrine ROS  Renal/GU      Musculoskeletal   Abdominal   Peds  Hematology negative hematology ROS (+)   Anesthesia Other Findings Past Medical History: No date: Anxiety No date: Eczema No date: Headache     Comment:  migraines No date: History of kidney stones  Past Surgical History: No date: WISDOM TOOTH EXTRACTION  BMI    Body Mass Index:  28.91 kg/m      Reproductive/Obstetrics negative OB ROS                             Anesthesia Physical Anesthesia Plan  ASA: II  Anesthesia Plan: General ETT   Post-op Pain Management:    Induction: Intravenous  PONV Risk Score and Plan: Ondansetron, Dexamethasone, Midazolam and Treatment may vary due to age or medical condition  Airway Management Planned: Oral ETT  Additional Equipment:   Intra-op Plan:   Post-operative Plan: Extubation in OR  Informed Consent: I have reviewed the patients History and Physical, chart, labs and discussed the procedure including the risks, benefits and alternatives for the proposed anesthesia with the patient or authorized representative who has indicated his/her understanding and acceptance.   Dental Advisory Given  Plan Discussed  with: Anesthesiologist, CRNA and Surgeon  Anesthesia Plan Comments: (Patient consented for risks of anesthesia including but not limited to:  - adverse reactions to medications - damage to teeth, lips or other oral mucosa - sore throat or hoarseness - Damage to heart, brain, lungs or loss of life  Patient voiced understanding.)        Anesthesia Quick Evaluation

## 2018-10-27 NOTE — Anesthesia Post-op Follow-up Note (Signed)
Anesthesia QCDR form completed.        

## 2018-10-28 ENCOUNTER — Encounter: Payer: Self-pay | Admitting: Obstetrics and Gynecology

## 2018-10-28 NOTE — Anesthesia Postprocedure Evaluation (Signed)
Anesthesia Post Note  Patient: Tanya Patterson  Procedure(s) Performed: LAPAROSCOPIC BILATERAL SALPINGECTOMY (Bilateral Abdomen)  Patient location during evaluation: PACU Anesthesia Type: General Level of consciousness: awake and alert Pain management: pain level controlled Vital Signs Assessment: post-procedure vital signs reviewed and stable Respiratory status: spontaneous breathing, nonlabored ventilation, respiratory function stable and patient connected to nasal cannula oxygen Cardiovascular status: blood pressure returned to baseline and stable Postop Assessment: no apparent nausea or vomiting Anesthetic complications: no     Last Vitals:  Vitals:   10/27/18 1644 10/27/18 1716  BP: 120/84 116/75  Pulse: 84 79  Resp: 16 16  Temp: 36.5 C 36.7 C  SpO2:  100%    Last Pain:  Vitals:   10/27/18 1716  TempSrc: Temporal  PainSc: 3                  Cleda MccreedyJoseph K Piscitello

## 2018-10-31 ENCOUNTER — Telehealth: Payer: Self-pay

## 2018-10-31 LAB — SURGICAL PATHOLOGY

## 2018-10-31 NOTE — Telephone Encounter (Signed)
I was speaking to patient and got disconnected. Please let me know when she calls back.

## 2018-10-31 NOTE — Telephone Encounter (Signed)
Pt states she is at work today and feels like a zombie kind of loopy. Pain in minimal 4-5 out of 10. She also states she has heart racing and feels a little anxious on the oxycodone.   I advised patient she probably went back to work early, hasn't had time to really start healing, not taking it easy. She does have a post op visit with AMS on Thursday 12/19. She states she really doesn't have time to take off, but I advised her if she isn't feeling well she won't be productive anyway.   I advised her to stop the pain medication if she thought she could and do Tylenol/Ibuprofen alternatively and see if she feels better as far as heart racing and anxious.   Pt voiced an understanding and is aware of AMS being in the office tomorrow. She will call for a note if she ends up leaving work today and is off tomorrow. She will keep her post-op appointment for Thursday.   Message forwarded to AMS to review.

## 2018-10-31 NOTE — Telephone Encounter (Signed)
Pt called me back stating she had to leave work early today. She will go home to rest and try again tomorrow. Her employer is requesting a note for this afternoon. It must read her restrictions. She is a Engineer, civil (consulting)nurse.  She has requested AMS to call her directly when he is here tomorrow.

## 2018-10-31 NOTE — Telephone Encounter (Signed)
Pt had laparoscopy surg last Thurs.  States she's just not feeling right.  Please call.  [phone]

## 2018-11-01 ENCOUNTER — Encounter: Payer: Self-pay | Admitting: Obstetrics and Gynecology

## 2018-11-01 NOTE — Telephone Encounter (Signed)
Letter is in her chart not printing for some reason

## 2018-11-01 NOTE — Telephone Encounter (Signed)
Pt spoke to AMS this morning.Pt has notes and will keep her appointment on Thursday.

## 2018-11-03 ENCOUNTER — Ambulatory Visit: Payer: BLUE CROSS/BLUE SHIELD | Admitting: Obstetrics and Gynecology

## 2018-11-04 ENCOUNTER — Ambulatory Visit: Payer: BLUE CROSS/BLUE SHIELD | Admitting: Obstetrics and Gynecology

## 2019-11-22 ENCOUNTER — Ambulatory Visit: Payer: Self-pay | Attending: Internal Medicine

## 2019-11-22 DIAGNOSIS — Z20822 Contact with and (suspected) exposure to covid-19: Secondary | ICD-10-CM | POA: Insufficient documentation

## 2019-11-23 LAB — NOVEL CORONAVIRUS, NAA: SARS-CoV-2, NAA: NOT DETECTED

## 2020-02-01 DIAGNOSIS — L209 Atopic dermatitis, unspecified: Secondary | ICD-10-CM | POA: Diagnosis not present

## 2020-02-07 ENCOUNTER — Ambulatory Visit: Payer: BLUE CROSS/BLUE SHIELD | Admitting: Advanced Practice Midwife

## 2020-02-28 DIAGNOSIS — F411 Generalized anxiety disorder: Secondary | ICD-10-CM | POA: Diagnosis not present

## 2020-02-28 DIAGNOSIS — F331 Major depressive disorder, recurrent, moderate: Secondary | ICD-10-CM | POA: Diagnosis not present

## 2020-03-06 DIAGNOSIS — F331 Major depressive disorder, recurrent, moderate: Secondary | ICD-10-CM | POA: Diagnosis not present

## 2020-03-06 DIAGNOSIS — F411 Generalized anxiety disorder: Secondary | ICD-10-CM | POA: Diagnosis not present

## 2020-03-14 DIAGNOSIS — Z0000 Encounter for general adult medical examination without abnormal findings: Secondary | ICD-10-CM | POA: Diagnosis not present

## 2020-03-14 DIAGNOSIS — R079 Chest pain, unspecified: Secondary | ICD-10-CM | POA: Diagnosis not present

## 2020-03-14 DIAGNOSIS — Z13220 Encounter for screening for lipoid disorders: Secondary | ICD-10-CM | POA: Diagnosis not present

## 2020-03-14 DIAGNOSIS — Z124 Encounter for screening for malignant neoplasm of cervix: Secondary | ICD-10-CM | POA: Diagnosis not present

## 2020-03-14 DIAGNOSIS — F5102 Adjustment insomnia: Secondary | ICD-10-CM | POA: Diagnosis not present

## 2020-03-14 DIAGNOSIS — F419 Anxiety disorder, unspecified: Secondary | ICD-10-CM | POA: Diagnosis not present

## 2020-03-20 DIAGNOSIS — F331 Major depressive disorder, recurrent, moderate: Secondary | ICD-10-CM | POA: Diagnosis not present

## 2020-03-20 DIAGNOSIS — F411 Generalized anxiety disorder: Secondary | ICD-10-CM | POA: Diagnosis not present

## 2021-07-22 ENCOUNTER — Ambulatory Visit: Admit: 2021-07-22 | Discharge status: Absent without leave | Payer: 59

## 2022-05-25 ENCOUNTER — Ambulatory Visit: Admit: 2022-05-25 | Discharge status: Against Medical Advice | Payer: 59
